# Patient Record
Sex: Female | Born: 1985 | Race: Asian | Hispanic: No | Marital: Married | Smoking: Never smoker
Health system: Southern US, Community
[De-identification: ages and names within clinical notes are randomized; demographics above are authoritative.]

## PROBLEM LIST (undated history)

## (undated) DIAGNOSIS — T8859XA Other complications of anesthesia, initial encounter: Secondary | ICD-10-CM

## (undated) DIAGNOSIS — O24419 Gestational diabetes mellitus in pregnancy, unspecified control: Secondary | ICD-10-CM

## (undated) DIAGNOSIS — K219 Gastro-esophageal reflux disease without esophagitis: Secondary | ICD-10-CM

---

## 2020-07-17 ENCOUNTER — Ambulatory Visit (INDEPENDENT_AMBULATORY_CARE_PROVIDER_SITE_OTHER): Payer: Medicaid Other | Admitting: Family Medicine

## 2020-07-17 ENCOUNTER — Other Ambulatory Visit: Payer: Self-pay

## 2020-07-17 VITALS — BP 100/62 | HR 73 | Wt 148.2 lb

## 2020-07-17 DIAGNOSIS — Z0000 Encounter for general adult medical examination without abnormal findings: Secondary | ICD-10-CM | POA: Diagnosis not present

## 2020-07-17 DIAGNOSIS — N920 Excessive and frequent menstruation with regular cycle: Secondary | ICD-10-CM

## 2020-07-17 DIAGNOSIS — Z23 Encounter for immunization: Secondary | ICD-10-CM

## 2020-07-17 NOTE — Patient Instructions (Addendum)
It was great seeing you today! Today we established care and are doing your blood work and you are getting your flu vaccine. I have also submitted a referral to OB/GYN. You should receive a call in the next 2 weeks to schedule an appointment, but if you do not hear from them please give [place] a call at the clinic  Please check-out at the front desk before leaving the clinic. I'd like to see you back in about 4 weeks to follow up on the dizziness you have been experiencing, but if you need to be seen earlier than that for any new issues we're happy to fit you in, just give [place] a call!  Visit Reminders: - Continue to work on your healthy eating habits and incorporating exercise into your daily life. - Medicine Changes: I recommend taking a prenatal vitamin with folic acid since you plan to get pregnant in the near future   Regarding lab work today:  Due to recent changes in healthcare laws, you may see the results of your imaging and laboratory studies on MyChart before your provider has had a chance to review them.  I understand that in some cases there may be results that are confusing or concerning to you. Not all laboratory results come back in the same time frame and you may be waiting for multiple results in order to interpret others.  Please give [place] 72 hours in order for your provider to thoroughly review all the results before contacting the office for clarification of your results. If everything is normal, you will get a letter in the mail or a message in My Chart. Please give [place] a call if you do not hear from [place] after 2 weeks.  Please bring all of your medications with you to each visit.    If you haven't already, sign up for My Chart to have easy access to your labs results, and communication with your primary care physician.  Feel free to call with any questions or concerns at any time, at [phone].   Take care,  Dr. Cora Collum [facility]

## 2020-07-17 NOTE — Progress Notes (Signed)
    SUBJECTIVE:   CHIEF COMPLAINT / HPI:   Ms. Vink is a 34yo who presents to establish care.  Dizziness: She currently complains of occasional dizziness. She states her blood pressures at home run on the lower side and thinks her dizziness may be associated. Denies having low blood sugar in the past. She denies the dizziness being positional when she turns or head or stands.   Heavy bleeding: Patient experiences a lot of bleeding during menstrual cycles last 6-7 days. She does report that her cycles do occur regularly. Needs to change more than 6-7 pads during the day. Does not notice blood clots. Feels week and tired during these episodes. Previous doctor referred her to gyn but they moved so were not able to see one. She is also preparing to have another child and expresses that she wants to make sure she is healthy prior to doing so.   Health maintenance: Believes last pap was in 2019, and that Hep C and HIV screens were done while pregnant in 2019.   Denies taking any medication. Had previously really bad allergies and saw an immunologist. Has not had issues with her allergies recently.  PERTINENT  PMH / PSH:  Seasonal allergies    OBJECTIVE:   BP 100/62   Pulse 73   Wt 148 lb 3.2 oz (67.2 kg)   SpO2 98%    General: well appearing, pleasant, NAD Heart: RRR no murmurs Lungs: CTAB. Normal WOB Abdomen: soft, non-distended, non-tender to palpation   ASSESSMENT/PLAN:   No problem-specific Assessment & Plan notes found for this encounter.  Menorrhagia  - check CBC for anemia - referral to gyn per patient's request   Health Maintenance - Hgb a1c, CBC, CMP - flu vaccine  Planning pregnancy - begin prenatal vitamin with folate   F/u in 2-4 weeks  Cora Collum, DO [facility]

## 2020-07-18 LAB — COMPREHENSIVE METABOLIC PANEL
ALT: 16 IU/L (ref 0–32)
AST: 15 IU/L (ref 0–40)
Albumin/Globulin Ratio: 1.7 (ref 1.2–2.2)
Albumin: 4.4 g/dL (ref 3.8–4.8)
Alkaline Phosphatase: 62 IU/L (ref 44–121)
BUN/Creatinine Ratio: 13 (ref 9–23)
BUN: 10 mg/dL (ref 6–20)
Bilirubin Total: 0.4 mg/dL (ref 0.0–1.2)
CO2: 24 mmol/L (ref 20–29)
Calcium: 9.1 mg/dL (ref 8.7–10.2)
Chloride: 102 mmol/L (ref 96–106)
Creatinine, Ser: 0.78 mg/dL (ref 0.57–1.00)
GFR calc Af Amer: 116 mL/min/{1.73_m2} (ref >59)
GFR calc non Af Amer: 101 mL/min/{1.73_m2} (ref >59)
Globulin, Total: 2.6 g/dL (ref 1.5–4.5)
Glucose: 87 mg/dL (ref 65–99)
Potassium: 4.1 mmol/L (ref 3.5–5.2)
Sodium: 139 mmol/L (ref 134–144)
Total Protein: 7 g/dL (ref 6.0–8.5)

## 2020-07-18 LAB — CBC WITH DIFFERENTIAL/PLATELET
Basophils Absolute: 0 10*3/uL (ref 0.0–0.2)
Basos: 0 %
EOS (ABSOLUTE): 0.1 10*3/uL (ref 0.0–0.4)
Eos: 1 %
Hematocrit: 39.1 % (ref 34.0–46.6)
Hemoglobin: 12.5 g/dL (ref 11.1–15.9)
Immature Grans (Abs): 0 10*3/uL (ref 0.0–0.1)
Immature Granulocytes: 0 %
Lymphocytes Absolute: 2.6 10*3/uL (ref 0.7–3.1)
Lymphs: 37 %
MCH: 27.1 pg (ref 26.6–33.0)
MCHC: 32 g/dL (ref 31.5–35.7)
MCV: 85 fL (ref 79–97)
Monocytes Absolute: 0.3 10*3/uL (ref 0.1–0.9)
Monocytes: 4 %
Neutrophils Absolute: 3.9 10*3/uL (ref 1.4–7.0)
Neutrophils: 58 %
Platelets: 256 10*3/uL (ref 150–450)
RBC: 4.61 x10E6/uL (ref 3.77–5.28)
RDW: 12.6 % (ref 11.7–15.4)
WBC: 6.9 10*3/uL (ref 3.4–10.8)

## 2020-07-18 LAB — HEMOGLOBIN A1C
Est. average glucose Bld gHb Est-mCnc: 111 mg/dL
Hgb A1c MFr Bld: 5.5 % (ref 4.8–5.6)

## 2020-10-05 ENCOUNTER — Encounter: Payer: Medicaid Other | Admitting: Obstetrics and Gynecology

## 2020-10-19 ENCOUNTER — Ambulatory Visit (INDEPENDENT_AMBULATORY_CARE_PROVIDER_SITE_OTHER): Payer: Self-pay | Admitting: Nurse Practitioner

## 2020-10-19 ENCOUNTER — Other Ambulatory Visit: Payer: Self-pay

## 2020-10-19 ENCOUNTER — Encounter: Payer: Self-pay | Admitting: Nurse Practitioner

## 2020-10-19 ENCOUNTER — Other Ambulatory Visit (HOSPITAL_COMMUNITY)
Admission: RE | Admit: 2020-10-19 | Discharge: 2020-10-19 | Disposition: A | Discharge status: Home / Self Care | Payer: Medicaid Other | Source: Ambulatory Visit | Attending: Obstetrics and Gynecology | Admitting: Obstetrics and Gynecology

## 2020-10-19 VITALS — BP 106/74 | HR 72 | Ht <= 58 in | Wt 146.7 lb

## 2020-10-19 DIAGNOSIS — N939 Abnormal uterine and vaginal bleeding, unspecified: Secondary | ICD-10-CM | POA: Diagnosis present

## 2020-10-19 DIAGNOSIS — Z01419 Encounter for gynecological examination (general) (routine) without abnormal findings: Secondary | ICD-10-CM

## 2020-10-19 MED ORDER — LEVONORGESTREL-ETHINYL ESTRAD 0.15-30 MG-MCG PO TABS: 1.0000 | ORAL_TABLET | Freq: Every day | ORAL | 11 refills | Status: DC

## 2020-10-19 NOTE — Progress Notes (Addendum)
GYNECOLOGY ANNUAL VISIT NOTE   History:  35 y.o. G1P1001 here today for extended menses. Last 2 cycles have been 15 days in length.  Wants to be pregnant.  Is planning a visit to her home country outside of the [place] for at least 2 months. She had a physical with CBC checked as she was having dizziness with her bleeding however she reports there was no anemia with that recent blood check.  She declines any blood work today.  Has noticed there are areas on the areola that get darker at the time of her cycle.  There is no drainage, no pain, no itching or peeling of skin on the areola.  She denies any abnormal vaginal discharge, bleeding, pelvic pain or other concerns.   History reviewed. No pertinent past medical history.  Past Surgical History:  Procedure Laterality Date  . CESAREAN SECTION      The following portions of the patient's history were reviewed and updated as appropriate: allergies, current medications, past family history, past medical history, past social history, past surgical history and problem list.    Health Maintenance:  Does not know when she had a pap - last child is 18.66 years of age and born in [place].  Review of Systems:  Pertinent items noted in HPI and remainder of comprehensive ROS otherwise negative.  Objective:  Physical Exam BP 106/74   Pulse 72   Ht 4\' 8"  (1.422 m)   Wt 146 lb 11.2 oz (66.5 kg)   BMI 32.89 kg/m  CONSTITUTIONAL: Well-developed, well-nourished female in no acute distress.  HENT:  Normocephalic, atraumatic. External right and left ear normal.  EYES: Conjunctivae and EOM are normal. Pupils are equal, round.  No scleral icterus.  NECK: Normal range of motion, supple, no masses SKIN: Skin is warm and dry. No rash noted. Not diaphoretic. No erythema. No pallor. NEUROLOGIC: Alert and oriented to person, place, and time. Normal muscle tone coordination. No cranial nerve deficit noted. PSYCHIATRIC: Normal mood and affect. Normal behavior.  Normal judgment and thought content. CARDIOVASCULAR: Normal heart rate noted RESPIRATORY: Effort and breath sounds normal, no problems with respiration noted ABDOMEN: Soft, no distention noted.   PELVIC: Normal appearing external genitalia; normal appearing vaginal mucosa and cervix.  No abnormal discharge noted.  Normal uterine size, no other palpable masses, no uterine or adnexal tenderness.  Pap done. MUSCULOSKELETAL: Normal range of motion. No edema noted. Breast:  No problems noted.  No masses, some darker areas seen on areola bilaterally but within normal range of glands seen on areola.   Labs and Imaging No results found.  Assessment & Plan:  1. Well female exam BMI 32 - weight loss advised Normal BP Nonsmoker  2. Abnormal vaginal bleeding Discussed plan to address bleeding since she wants to be pregnant.  Does want to control the bleeding while she is out of the country.  Denies any headaches or family history of blood clots. Will begin pills and take them while she is visiting out of the country. Will try to become pregnant once she returns from her visit. Reassured that breasts and nipple area is normal.  - GC/Chlamydia probe amp ([place])not at [facility] - Cytology - PAP( [place])   Routine preventative health maintenance measures emphasized. Please refer to After Visit Summary for other counseling recommendations.   Return in about 6 months (around 04/21/2021) for RN for BP check.   Total face-to-face time with patient: 20  minutes.  Over 50% of encounter  was spent on counseling and coordination of care.  Nolene Bernheim, RN, MSN, NP-BC Nurse Practitioner, [facility] for [employer], [facility] 10/19/2020 1:48 PM

## 2020-10-19 NOTE — Progress Notes (Signed)
Patient states the first 2-3 of cycle flow is light but she states that she becomes lightheaded. The remainder of cycle (4 days)  is heavy and patient states that she has cramping and lower back pain. Complains of excessive bleeds and bumps shows up around the nipple area but goes away after cycle. Patient stated that she does not remember when she had a pap smear.  Also, offered food market for patient and she declined  Swara Donze, CMA

## 2020-10-19 NOTE — Patient Instructions (Signed)
Oral Contraception Use Oral contraceptive pills (OCPs) are medicines that prevent pregnancy. OCPs work by:  Preventing the ovaries from releasing eggs.  Thickening mucus in the lower part of the uterus (cervix). This prevents sperm from entering the uterus.  Thinning the lining of the uterus (endometrium). This prevents a fertilized egg from attaching to the endometrium. Discuss possible side effects of OCPs with your health care provider. It can take 2-3 months for your body to adjust to changes in hormone levels. What are the risks? OCPs can sometimes cause side effects, such as:  Headache.  Depression.  Trouble sleeping.  Nausea and vomiting.  Breast tenderness.  Irregular bleeding or spotting during the first several months.  Bloating or fluid retention.  Increase in blood pressure. OCPs with estrogen and progestins may slightly increase the risk of:  Blood clots.  Heart attack.  Stroke. How to take OCPs Follow instructions from your health care provider about how to take your first cycle of OCPs. There are 2 types of OCPs. The first, combination OCPs, have both estrogen and progestins. The second, progestin-only pills, have only progestin.  For combination OCPs, you may start the pill: ? On day 1 of your menstrual period. ? On the first Sunday after your period starts, or on the day you get your prescription. ? At any time of your cycle. ? If you start taking the pill within 5 days after the start of your period, you will not need a backup form of birth control, such as condoms. ? If you start at any other time of your menstrual cycle, you will need to use a backup form of birth control.  For progestin-only OCPs: ? Ideally, you can start taking the pill on the first day of your menstrual period, but you can start it on any other day too. ? These pills will protect you from pregnancy after taking it for 2 days (48 hours). You can stop using a backup form of birth  control after that time. It is important that you take this pill at the same time every day. Even taking it 3 hours late can increase the risk of pregnancy. No matter which day you start the OCP, you will always start a new pack on that same day of the week. Have an extra pack of OCPs and a backup contraceptive method available in case you miss some pills or lose your OCP pack. Missed doses Follow instructions from your health care provider for missed doses. Information about missed doses can also be found in the patient information sheet that comes with your pack of pills.  In general, for combined OCPs: ? If you forget to take the pill for 1 day, take it as soon as you can. This may mean taking 2 pills on the same day and at the same time. Take the next day's pill at the regular time. ? If you forget to take the pill for 2 days in a row, take 2 tablets on the day you remember and 2 tablets on the following day. A backup form of birth control should be used for 7 days after you are back on schedule. ? If you forget to take the pill for 3 days in a row, call your health care provider for directions on when to restart taking your pills. Do not take the missed pills. A backup form of birth control will be needed for 7 days once you restart your pills. ? If you use a pack that   contains inactive pills and you miss 1 or more of the inactive pills, you do not need to take the missed doses. Skip them and start the new pack on the regular day.  For progestin-only OCPs: ? If your dose is 3 hours or more late, or if you miss 1 or more doses, take 1 missed pill as soon as you can. ? If you miss one or more doses, you must use a backup form of birth control. Some brands of progestin-only pills recommend using a backup form of birth control for 48 hours after a missed or late dose while others recommend 7 days. If you are not sure what to do, call your health care provider or check the patient information sheet that  came with your pills.   Follow these instructions at home:  Do not use any products that contain nicotine or tobacco. These include cigarettes, chewing tobacco, or vaping devices, such as e-cigarettes. If you need help quitting, ask your health care provider.  Always use a condom to protect against STIs (sexually transmitted infections). Oral contraception pills do not protect against STIs.  Use a calendar to mark the days of your menstrual period.  Read the information sheet and directions that came with your OCP. Talk to your health care provider if you have questions. Contact a health care provider if:  You develop nausea and vomiting.  You have abnormal vaginal discharge or bleeding.  You develop a rash.  You miss your menstrual period. Depending on the type of OCP you are taking, this may be a sign of pregnancy.  You are losing your hair.  You need treatment for mood swings or depression.  You get dizzy when taking the OCP.  You develop acne after taking the OCP.  You become pregnant or think you may be pregnant.  You have diarrhea, constipation, and abdominal pain or cramps.  You are not sure what to do after missing pills. Get help right away if:  You develop chest pain.  You develop shortness of breath.  You have an uncontrolled or severe headache.  You develop numbness or slurred speech.  You develop vision or speech problems.  You develop pain, redness, and swelling in your legs.  You develop weakness or numbness in your arms or legs. These symptoms may represent a serious problem that is an emergency. Do not wait to see if the symptoms will go away. Get medical help right away. Call your local emergency services (911 in the [place] Do not drive yourself to the hospital. Summary  Oral contraceptive pills (OCPs) are medicines that you take to prevent pregnancy.  OCPs do not prevent sexually transmitted infections (STIs). Always use a condom to protect  against STIs.  When you start an OCP, be aware that it can take 2-3 months for your body to adjust to changes in hormone levels.  Read all the information and directions that come with your OCP. This information is not intended to replace advice given to you by your health care provider. Make sure you discuss any questions you have with your health care provider. Document Revised: 04/09/2020 Document Reviewed: 04/09/2020 Elsevier Patient Education  2021 Elsevier Inc.  

## 2020-10-20 LAB — CYTOLOGY - PAP
Adequacy: ABSENT
Comment: NEGATIVE
Diagnosis: NEGATIVE
High risk HPV: NEGATIVE

## 2020-10-20 LAB — GC/CHLAMYDIA PROBE AMP (~~LOC~~) NOT AT ARMC
Chlamydia: NEGATIVE
Comment: NEGATIVE
Comment: NORMAL
Neisseria Gonorrhea: NEGATIVE

## 2020-10-22 MED ORDER — FLUCONAZOLE 150 MG PO TABS: 150.0000 mg | ORAL_TABLET | Freq: Once | ORAL | 0 refills | Status: AC

## 2020-10-22 NOTE — Addendum Note (Signed)
Addended by: Currie Paris on: 10/22/2020 08:22 AM   Modules accepted: Orders

## 2020-10-25 NOTE — Addendum Note (Signed)
Addended by: Currie Paris on: 10/25/2020 08:48 PM   Modules accepted: Level of Service

## 2021-10-22 NOTE — Progress Notes (Incomplete)
° ° ° °  SUBJECTIVE:   CHIEF COMPLAINT / HPI:   Erin Avery is a 36 y.o. female presents for abdominal pain   Positive pregnancy LMP Feb 6th. Period was certain, cycles are regular. No birth control for 3 months prior.Takes folic acid. Took home pregnancy test which was positive. Has one son age 75. Pt is G1P1.  Abdominal pain  Pt endorses right sided lower abdominal pain started 2 weeks ago, intermittent in nature. Sometimes does have it on the left. Onset was gradual. No radiation.  Described as mild sharp/dull type of pain. Continuous most of the time. Alleviated by prune juice. Exacerbated by spicy food. Severity 9 /10. Associated symptoms include vaginal spotting, nausea and some cramps yesterday. Does endorse constipation. Last BM yesterday.  Denies vaginal bleeding, fluid leakage. First pregnancy she has right sided pain, ectopic pregnancy was suspected, it was worked up which was negative.    ***  Flowsheet Row [facility]  PHQ-9 Total Score 0        PERTINENT  PMH / PSH:   OBJECTIVE:   BP 106/72    Pulse 85    Ht 4\' 8"  (1.422 m)    Wt 145 lb 12.8 oz (66.1 kg)    LMP 09/20/2021 (Exact Date)    SpO2 98%    BMI 32.69 kg/m    General: Alert, no acute distress Cardio: Normal S1 and S2, RRR, no r/m/g Pulm: CTAB, normal work of breathing Abdomen: Bowel sounds normal. Abdomen soft and non-tender.  Extremities: No peripheral edema.  Neuro: Cranial nerves grossly intact    Pelvic Exam chaperoned by CMA **         External: normal female genitalia without lesions or masses        Vagina: normal without lesions or masses        Cervix: normal without lesions or masses        Pap smear: performed        Samples for Wet prep, GC/Chlamydia obtained   ASSESSMENT/PLAN:   No problem-specific Assessment & Plan notes found for this encounter.    11/18/2021, MD PGY-3 [facility]

## 2021-10-25 ENCOUNTER — Other Ambulatory Visit: Payer: Self-pay

## 2021-10-25 ENCOUNTER — Inpatient Hospital Stay (HOSPITAL_COMMUNITY): Payer: Medicaid Other

## 2021-10-25 ENCOUNTER — Ambulatory Visit (INDEPENDENT_AMBULATORY_CARE_PROVIDER_SITE_OTHER): Payer: Medicaid Other | Admitting: Family Medicine

## 2021-10-25 ENCOUNTER — Inpatient Hospital Stay (HOSPITAL_COMMUNITY)
Admission: AD | Admit: 2021-10-25 | Discharge: 2021-10-25 | Disposition: A | Discharge status: Home / Self Care | Payer: Medicaid Other | Attending: Obstetrics and Gynecology | Admitting: Obstetrics and Gynecology

## 2021-10-25 ENCOUNTER — Encounter (HOSPITAL_COMMUNITY): Payer: Self-pay | Admitting: Obstetrics and Gynecology

## 2021-10-25 VITALS — BP 106/72 | HR 85 | Ht <= 58 in | Wt 145.8 lb

## 2021-10-25 DIAGNOSIS — R10813 Right lower quadrant abdominal tenderness: Secondary | ICD-10-CM | POA: Insufficient documentation

## 2021-10-25 DIAGNOSIS — O26891 Other specified pregnancy related conditions, first trimester: Secondary | ICD-10-CM | POA: Insufficient documentation

## 2021-10-25 DIAGNOSIS — O2341 Unspecified infection of urinary tract in pregnancy, first trimester: Secondary | ICD-10-CM | POA: Diagnosis not present

## 2021-10-25 DIAGNOSIS — R109 Unspecified abdominal pain: Secondary | ICD-10-CM | POA: Diagnosis not present

## 2021-10-25 DIAGNOSIS — R110 Nausea: Secondary | ICD-10-CM | POA: Insufficient documentation

## 2021-10-25 DIAGNOSIS — Z3491 Encounter for supervision of normal pregnancy, unspecified, first trimester: Secondary | ICD-10-CM

## 2021-10-25 DIAGNOSIS — R1031 Right lower quadrant pain: Secondary | ICD-10-CM | POA: Diagnosis not present

## 2021-10-25 DIAGNOSIS — Z3A01 Less than 8 weeks gestation of pregnancy: Secondary | ICD-10-CM | POA: Insufficient documentation

## 2021-10-25 DIAGNOSIS — N8312 Corpus luteum cyst of left ovary: Secondary | ICD-10-CM | POA: Diagnosis not present

## 2021-10-25 DIAGNOSIS — Z6730 Type AB blood, Rh positive: Secondary | ICD-10-CM | POA: Diagnosis not present

## 2021-10-25 DIAGNOSIS — N390 Urinary tract infection, site not specified: Secondary | ICD-10-CM | POA: Diagnosis not present

## 2021-10-25 DIAGNOSIS — Z3490 Encounter for supervision of normal pregnancy, unspecified, unspecified trimester: Secondary | ICD-10-CM

## 2021-10-25 LAB — CBC
HCT: 38.4 % (ref 36.0–46.0)
Hemoglobin: 12.5 g/dL (ref 12.0–15.0)
MCH: 27.4 pg (ref 26.0–34.0)
MCHC: 32.6 g/dL (ref 30.0–36.0)
MCV: 84 fL (ref 80.0–100.0)
Platelets: 249 10*3/uL (ref 150–400)
RBC: 4.57 MIL/uL (ref 3.87–5.11)
RDW: 13.1 % (ref 11.5–15.5)
WBC: 7.5 10*3/uL (ref 4.0–10.5)
nRBC: 0 % (ref 0.0–0.2)

## 2021-10-25 LAB — URINALYSIS, ROUTINE W REFLEX MICROSCOPIC
Bilirubin Urine: NEGATIVE
Glucose, UA: NEGATIVE mg/dL
Hgb urine dipstick: NEGATIVE
Ketones, ur: NEGATIVE mg/dL
Nitrite: NEGATIVE
Protein, ur: NEGATIVE mg/dL
Specific Gravity, Urine: 1.025 (ref 1.005–1.030)
pH: 5 (ref 5.0–8.0)

## 2021-10-25 LAB — POCT URINE PREGNANCY: Preg Test, Ur: POSITIVE — AB

## 2021-10-25 LAB — HCG, QUANTITATIVE, PREGNANCY: hCG, Beta Chain, Quant, S: 16956 m[IU]/mL — ABNORMAL HIGH (ref <5)

## 2021-10-25 LAB — ABO/RH: ABO/RH(D): AB POS

## 2021-10-25 MED ORDER — CEFADROXIL 500 MG PO CAPS: 500.0000 mg | ORAL_CAPSULE | Freq: Two times a day (BID) | ORAL | 0 refills | Status: AC

## 2021-10-25 NOTE — Patient Instructions (Signed)
Thank you for coming to see me today. It was a pleasure. You are pregnant-congratulations!!  ? ?Today we discussed your belly pains, it could be due to constipation but we need to rule out an ectropic pregnancy, I recommend going to the [facility] immediately for an ultrasound.  ? ?We will get some labs today.  If they are abnormal or we need to do something about them, I will call you.  If they are normal, I will send you a message on MyChart (if it is active) or a letter in the mail.  If you don't hear from [place] in 2 weeks, please call the office at the number below.  ? ?If everything is normal, come back for an initial ob visit which you can book at the front desk  ? ?If you have any questions or concerns, please do not hesitate to call the office at [phone]. ? ?Best wishes,  ? ?Dr Allena Katz   ?

## 2021-10-25 NOTE — Discharge Instructions (Signed)
Return to care  If you have heavier bleeding that soaks through more than 2 pads per hour for an hour or more If you bleed so much that you feel like you might pass out or you do pass out If you have significant abdominal pain that is not improved with Tylenol   

## 2021-10-25 NOTE — MAU Provider Note (Signed)
?History  ?  ? ?[id number] ? ?Arrival date and time: 10/25/21 1011 ?  ? ?Chief Complaint  ?Patient presents with  ? Abdominal Pain  ? ? ? ?HPI ?Erin Avery is a 36 y.o. at [redacted]w[redacted]d by LMP with PMHx notable for previous c/section, who presents for abdominal pain. ?Reports abdominal pain that started a few weeks ago & worsened in the last few days. Pain in right lower quadrant. Rates pain 7/10. No aggravating factors. Hasn't treated symptoms. Reports some daily morning nausea; no vomiting. Also reports dysuria for the last few days. Denies fever/chills, diarrhea, constipation, flank pain, or vaginal discharge. Had some vaginal spotting last week; currently denies vaginal bleeding.  ? ?OB History   ? ? Gravida  ?2  ? Para  ?1  ? Term  ?1  ? Preterm  ?   ? AB  ?   ? Living  ?1  ?  ? ? SAB  ?   ? IAB  ?   ? Ectopic  ?   ? Multiple  ?   ? Live Births  ?1  ?   ?  ?  ? ? ?History reviewed. No pertinent past medical history. ? ?Past Surgical History:  ?Procedure Laterality Date  ? CESAREAN SECTION    ? ? ?Family History  ?Problem Relation Age of Onset  ? Diabetes Mother   ? Hypertension Mother   ? Asthma Father   ? ? ?No Known Allergies ? ?No current facility-administered medications on file prior to encounter.  ? ?Current Outpatient Medications on File Prior to Encounter  ?Medication Sig Dispense Refill  ? acetaminophen (TYLENOL) 500 MG tablet Take 500 mg by mouth every 6 (six) hours as needed.    ? levonorgestrel-ethinyl estradiol (PORTIA-28) 0.15-30 MG-MCG tablet Take 1 tablet by mouth daily. 28 tablet 11  ? ? ? ?ROS ?Pertinent positives and negative per HPI, all others reviewed and negative ? ?Physical Exam  ? ?BP 112/71   Pulse 78   Temp 98.8 ?F (37.1 ?C)   Resp 18   LMP 09/20/2021 (Exact Date)  ? ?Patient Vitals for the past 24 hrs: ? BP Temp Pulse Resp  ?10/25/21 1037 112/71 98.8 ?F (37.1 ?C) 78 18  ? ? ?Physical Exam ?Vitals and nursing note reviewed.  ?Constitutional:   ?   General: She is not in acute  distress. ?   Appearance: She is well-developed. She is not ill-appearing.  ?HENT:  ?   Head: Normocephalic and atraumatic.  ?Pulmonary:  ?   Effort: Pulmonary effort is normal. No respiratory distress.  ?Abdominal:  ?   General: Abdomen is flat. Bowel sounds are normal. There is no distension.  ?   Palpations: Abdomen is soft.  ?   Tenderness: There is abdominal tenderness (tender to deep palpation in RLQ) in the right lower quadrant. There is no right CVA tenderness, left CVA tenderness, guarding or rebound.  ?Skin: ?   General: Skin is warm and dry.  ?Neurological:  ?   Mental Status: She is alert.  ?Psychiatric:     ?   Mood and Affect: Mood normal.     ?   Behavior: Behavior normal.  ?  ? ?Labs ?Results for orders placed or performed during the hospital encounter of 10/25/21 (from the past 24 hour(s))  ?Urinalysis, Routine w reflex microscopic Urine, Clean Catch     Status: Abnormal  ? Collection Time: 10/25/21 11:01 AM  ?Result Value Ref Range  ? Color, Urine YELLOW YELLOW  ?  APPearance HAZY (A) CLEAR  ? Specific Gravity, Urine 1.025 1.005 - 1.030  ? pH 5.0 5.0 - 8.0  ? Glucose, UA NEGATIVE NEGATIVE mg/dL  ? Hgb urine dipstick NEGATIVE NEGATIVE  ? Bilirubin Urine NEGATIVE NEGATIVE  ? Ketones, ur NEGATIVE NEGATIVE mg/dL  ? Protein, ur NEGATIVE NEGATIVE mg/dL  ? Nitrite NEGATIVE NEGATIVE  ? Leukocytes,Ua TRACE (A) NEGATIVE  ? RBC / HPF 0-5 0 - 5 RBC/hpf  ? WBC, UA 6-10 0 - 5 WBC/hpf  ? Bacteria, UA RARE (A) NONE SEEN  ? Squamous Epithelial / LPF 0-5 0 - 5  ? Mucus PRESENT   ?CBC     Status: None  ? Collection Time: 10/25/21 11:46 AM  ?Result Value Ref Range  ? WBC 7.5 4.0 - 10.5 K/uL  ? RBC 4.57 3.87 - 5.11 MIL/uL  ? Hemoglobin 12.5 12.0 - 15.0 g/dL  ? HCT 38.4 36.0 - 46.0 %  ? MCV 84.0 80.0 - 100.0 fL  ? MCH 27.4 26.0 - 34.0 pg  ? MCHC 32.6 30.0 - 36.0 g/dL  ? RDW 13.1 11.5 - 15.5 %  ? Platelets 249 150 - 400 K/uL  ? nRBC 0.0 0.0 - 0.2 %  ?ABO/Rh     Status: None  ? Collection Time: 10/25/21 11:46 AM  ?Result  Value Ref Range  ? ABO/RH(D) AB POS   ? No rh immune globuloin    ?  NOT A RH IMMUNE GLOBULIN CANDIDATE, PT RH POSITIVE ?Performed at [facility], [address] [place], [place] [id number] ?  ?hCG, quantitative, pregnancy     Status: Abnormal  ? Collection Time: 10/25/21 11:46 AM  ?Result Value Ref Range  ? hCG, Beta Chain, Quant, S 16,956 (H) <5 mIU/mL  ? ? ?Imaging ?US OB LESS THAN 14 WEEKS WITH OB TRANSVAGINAL ? ?Result Date: 10/25/2021 ?CLINICAL DATA:  Abdominal pain for 2 weeks during first trimester of pregnancy, LMP 09/20/2021 EXAM: OBSTETRIC <14 WK US AND TRANSVAGINAL OB US TECHNIQUE: Both transabdominal and transvaginal ultrasound examinations were performed for complete evaluation of the gestation as well as the maternal uterus, adnexal regions, and pelvic cul-de-sac. Transvaginal technique was performed to assess early pregnancy. COMPARISON:  None FINDINGS: Intrauterine gestational sac: Present, single Yolk sac:  Present Embryo:  Not identified Cardiac Activity: N/A Heart Rate: N/A  bpm MSD: 10.9 mm   5 w   5 d Subchorionic hemorrhage:  None identified Maternal uterus/adnexae: Uterus otherwise unremarkable. LEFT ovary measures 2.7 x 2.8 x 2.3 cm conned and contains a small corpus luteal cyst. RIGHT ovary normal size and morphology, 2.5 x 1.9 x 1.5 cm. Trace free pelvic fluid. No adnexal masses. IMPRESSION: Small gestational sac within the uterus containing a yolk sac, EGA [redacted] weeks 5 days by MSD. No fetal pole identified to establish viability; may consider follow-up ultrasound in 14 days to establish viability if clinically indicated. No acute abnormalities. Electronically Signed   By: Ulyses SouthwardMark  Boles M.D.   On: 10/25/2021 12:31   ? ?MAU Course  ?Procedures ?Lab Orders    ?     Culture, OB Urine    ?     Urinalysis, Routine w reflex microscopic Urine, Clean Catch    ?     CBC    ?     hCG, quantitative, pregnancy    ?Meds ordered this encounter  ?Medications  ? cefadroxil (DURICEF) 500 MG capsule  ?   Sig: Take 1 capsule (500 mg total) by mouth 2 (two)  times daily for 7 days.  ?  Dispense:  14 capsule  ?  Refill:  0  ?  Order Specific Question:   Supervising Provider  ?  AnswerMilas Hock [[id number]]  ? ?Imaging Orders    ?     US OB LESS THAN 14 WEEKS WITH OB TRANSVAGINAL    ? ? ?MDM ?+UPT ?UA, CBC, ABO/Rh, quant hCG, and [place] today to rule out ectopic pregnancy which can be life threatening.  ? ?Patient presents with abdominal pain. Benign abdominal exam. Afebrile.  ?Ultrasound shows intrauterine gestational sac with yolk sac. Appropriate for dating by LMP.  ? ?U/a with some leuks & bacteria. Patient reports 2 day history of dysuria. Will start on duricef. Urine culture sent.  ? ?Assessment and Plan  ? ?1. Normal IUP (intrauterine pregnancy) on prenatal ultrasound, first trimester  ?-Patient to follow up with Family Medicine for prenatal care. Consider viability ultrasound in 2 weeks which can be ordered by them.   ?2. Abdominal pain during pregnancy in first trimester  ?-Reviewed miscarriage precautions & reasons to return to MAU  ?3. UTI (urinary tract infection) during pregnancy, first trimester  ?-Rx duricef ?-Urine culture pending  ?4. [redacted] weeks gestation of pregnancy   ? ? ? ?Judeth Horn, NP ?10/25/21 ?1:15 PM ? ? ?

## 2021-10-25 NOTE — MAU Note (Signed)
.  Erin Avery is a 36 y.o. at [redacted]w[redacted]d here in MAU reporting: pain on lower right abd that started about 2 weeks ago. Went to PCP today and was sent here due to pain. Denies any vaginal discharge. Had some spotting a few days ago but none today.  ?LMP: 09/20/21 ?Onset of complaint: 2 weeks ?Pain score: 7/10 ?Vitals:  ? 10/25/21 1037  ?BP: 112/71  ?Pulse: 78  ?Resp: 18  ?Temp: 98.8 ?F (37.1 ?C)  ?   ?FHT:n/a ?Lab orders placed from triage:  u/a ? ?

## 2021-10-26 DIAGNOSIS — Z3490 Encounter for supervision of normal pregnancy, unspecified, unspecified trimester: Secondary | ICD-10-CM | POA: Insufficient documentation

## 2021-10-26 LAB — CULTURE, OB URINE
Culture: <10000 — AB
Special Requests: NORMAL

## 2021-10-26 NOTE — Assessment & Plan Note (Addendum)
Congratulated pt and husband on pregnancy. Given lower abdominal pain there is a concern for ectopic pregnancy. Pt is hemodynamically stable which is reassuring. Recommended pt go to the MAU immediately for early [place]. Obtained OB labs. Recommended pt should follow up in clinic for initial OB visit in the new few weeks and continue taking pre-natals/folic acid. ?

## 2021-10-28 LAB — HCV INTERPRETATION

## 2021-10-28 LAB — MICROSCOPIC EXAMINATION
Casts: NONE SEEN /lpf
Epithelial Cells (non renal): >10 /hpf — AB (ref 0–10)
RBC, Urine: NONE SEEN /hpf (ref 0–2)

## 2021-10-28 LAB — CBC/D/PLT+RPR+RH+ABO+RUBIGG...
Antibody Screen: NEGATIVE
Basophils Absolute: 0 10*3/uL (ref 0.0–0.2)
Basos: 0 %
Bilirubin, UA: NEGATIVE
EOS (ABSOLUTE): 0.1 10*3/uL (ref 0.0–0.4)
Eos: 1 %
Glucose, UA: NEGATIVE
HCV Ab: NONREACTIVE
HIV Screen 4th Generation wRfx: NONREACTIVE
Hematocrit: 36.3 % (ref 34.0–46.6)
Hemoglobin: 11.9 g/dL (ref 11.1–15.9)
Hepatitis B Surface Ag: NEGATIVE
Immature Grans (Abs): 0 10*3/uL (ref 0.0–0.1)
Immature Granulocytes: 0 %
Ketones, UA: NEGATIVE
Lymphocytes Absolute: 2.1 10*3/uL (ref 0.7–3.1)
Lymphs: 29 %
MCH: 27 pg (ref 26.6–33.0)
MCHC: 32.8 g/dL (ref 31.5–35.7)
MCV: 82 fL (ref 79–97)
Monocytes Absolute: 0.3 10*3/uL (ref 0.1–0.9)
Monocytes: 4 %
Neutrophils Absolute: 4.9 10*3/uL (ref 1.4–7.0)
Neutrophils: 66 %
Nitrite, UA: NEGATIVE
Platelets: 267 10*3/uL (ref 150–450)
RBC, UA: NEGATIVE
RBC: 4.41 x10E6/uL (ref 3.77–5.28)
RDW: 12.9 % (ref 11.7–15.4)
RPR Ser Ql: NONREACTIVE
Rh Factor: POSITIVE
Rubella Antibodies, IGG: 7.88 index (ref >0.99)
Specific Gravity, UA: 1.025 (ref 1.005–1.030)
Urobilinogen, Ur: 0.2 mg/dL (ref 0.2–1.0)
WBC: 7.5 10*3/uL (ref 3.4–10.8)
pH, UA: 6 (ref 5.0–7.5)

## 2021-10-28 LAB — HGB FRACTIONATION CASCADE
Hgb A2: 2.7 % (ref 1.8–3.2)
Hgb A: 97.3 % (ref 96.4–98.8)
Hgb F: 0 % (ref 0.0–2.0)
Hgb S: 0 %

## 2021-10-28 LAB — URINE CULTURE, OB REFLEX

## 2021-11-04 ENCOUNTER — Other Ambulatory Visit (HOSPITAL_COMMUNITY)
Admission: RE | Admit: 2021-11-04 | Discharge: 2021-11-04 | Disposition: A | Discharge status: Home / Self Care | Payer: Medicaid Other | Source: Ambulatory Visit | Attending: Family Medicine | Admitting: Family Medicine

## 2021-11-04 ENCOUNTER — Ambulatory Visit (INDEPENDENT_AMBULATORY_CARE_PROVIDER_SITE_OTHER): Payer: Medicaid Other | Admitting: Family Medicine

## 2021-11-04 ENCOUNTER — Other Ambulatory Visit: Payer: Self-pay

## 2021-11-04 ENCOUNTER — Encounter: Payer: Self-pay | Admitting: Family Medicine

## 2021-11-04 VITALS — BP 105/67 | HR 77 | Wt 147.0 lb

## 2021-11-04 DIAGNOSIS — Z3491 Encounter for supervision of normal pregnancy, unspecified, first trimester: Secondary | ICD-10-CM | POA: Insufficient documentation

## 2021-11-04 DIAGNOSIS — Z3A01 Less than 8 weeks gestation of pregnancy: Secondary | ICD-10-CM | POA: Insufficient documentation

## 2021-11-04 NOTE — Patient Instructions (Signed)
It was wonderful to meet you today. Thank you for allowing me to be a part of your care. Below is a short summary of what we discussed at your visit today: ? ?Pregnancy ?Today we believe you are 6 weeks and 3 days along.  ? ?Go to your ultrasound appointment, details on the next page.  ? ?Go to your next prenatal appointment in about one month. Details on the next page. At this next appointment, you will need a 1-hour glucose test to screen for pregnancy related diabetes.  ? ?Keep taking your prenatal multivitamin.  ? ?Prenatal Classes ?Go to [email or web address] for more information on the pregnancy and child birth classes that [place] has to offer.  ? ?Emergency Planning ?If you experience any vaginal bleeding, leakage of fluids, don't feel your baby moving as much, or start to have contractions less than 5 minutes apart please go directly to the Maternal Assessment Unit at [facility] for evaluation. ? ?Maternity and women's care services located on the [place] side of [facility] [place]. [facility] (Entrance C off [address]).  ?[address] ?[place],  [place]  [address] ? ?  ? ?Maternal Mental Health ?If you start to develop the below symptoms of depression, please reach out to [place] for an appointment. There is also a [place] at [phone]). This hotline has trained counselors, doulas, and midwifes to real-time support, information, and resources.  ?Feeling sad or hopeless most of the time ?Lack of interest in things you used to enjoy ?Less interest in caring for yourself (dressing, fixing hair) ?Trouble concentrating ?Trouble coping with daily tasks ?Constant worry about your baby ?Sleeping or eating too much or too little ?Feeling very anxious or nervous ?Unexplained irritability or anger ?Unwanted or scary thoughts ?Feeling that you are not a good mother ?Thoughts of hurting yourself or  your baby ? ?If you feel you are experiencing a mental health crisis, please reach out to the [place] at 1-800-273-TALK [phone]) or go directly to the [facility], open 24/7.  ?[phone] ?[address] [place], [place] [address]  ? ? ?Please bring all of your medications to every appointment! ? ?If you have any questions or concerns, please do not hesitate to contact us via phone or MyChart message.  ? ?Ezequiel Essex, MD  ?

## 2021-11-04 NOTE — Progress Notes (Signed)
?Patient Name: Erin Avery ?Date of Birth: 11/12/1985 ?[place] Initial Prenatal Visit ? ?Erin Avery is a 36 y.o. year old G2P1001 at [redacted]w[redacted]d who presents for her initial prenatal visit. ?Pregnancy is planned ?She reports nausea. ?She is taking a prenatal vitamin.  ?She denies pelvic pain or vaginal bleeding.  ? ?Minor n/v, still able to eat ?Some abdominal pain, L then R, intermittent, worse when constipated ? ?Pregnancy Dating: ?The patient is dated by LMP.  ?LMP: 09/20/2021 ?Period is certain:  Yes.  ?Periods were regular:  Yes.  ?LMP was a typical period:  Yes.  ?Using hormonal contraception in 3 months prior to conception: No ? ?Lab Review: ?Blood type: AB POS ?Rh Status: + ?Antibody screen: Negative ?HIV: Negative ?RPR: Negative ?Hemoglobin electrophoresis reviewed: Yes - normal ?Results of OB urine culture are: Negative ?Rubella: Immune ?Hep C Ab: Negative ?Varicella status is Unknown - never had chicken pox infection, unsure if she has had vaccine ? ?PMH: Reviewed and as detailed below: ?HTN: No  ?Gestational Hypertension/preeclampsia: No  ?Type 1 or 2 Diabetes: No  ?Depression:  No  ?Seizure disorder:  No ?VTE: No ,  ?History of STI No,  ?Abnormal Pap smear:  No, ?Genital herpes simplex:  No  ? ?PSH: ?Gynecologic Surgery:  CS with first pregnancy ?Surgical history reviewed, no prior surgeries before CS ? ?Obstetric History: ?Obstetric history tab updated and reviewed.  ?Summary of prior pregnancies: Healthy ?Cesarean delivery: Yes  ?Gestational Diabetes:  No ?Hypertension in pregnancy: No ?History of preterm birth: No ?History of LGA/SGA infant:  No ?History of shoulder dystocia: No ?Indications for referral were reviewed, and the patient has no obstetric indications for referral to [facility] at this time.  ? ?Social History: ?Partner's name: Erin Avery, husband ?Tobacco use: No ?Alcohol use:  No ?Other substance use:  No ? ?Current Medications:  ?Prenatal  vitamins ?No prescription medications ?No over the counter medications ?Reviewed and appropriate in pregnancy.  ? ?Genetic and Infection Screen: ?Flow Sheet Updated Yes ? ?Prenatal Exam: ? ?Blood pressure 105/67, pulse 77, weight 147 lb (66.7 kg), last menstrual period 09/20/2021.  ? ?Physical Exam ?General: Awake, alert, oriented, no acute distress ?Respiratory: Normal work of breathing, no respiratory distress ?Neuro: Cranial nerves II through X grossly intact, able to move all extremities spontaneously ?Vulva: Normal appearing vulva without rashes, lesions, or deformities ?Vagina: Pale pink rugated vaginal tissue without obvious lesions, physiologic discharge of whitish color, cervix erythematous, no overt tenderness with swab, cervix with minor bleeding s/p PAP brush ? ?Fetal heart tones: Not assessed, given early gestation ? ?Assessment/Plan: ? ?Erin Avery is a 36 y.o. G2P1001 at [redacted]w[redacted]d who presents to initiate prenatal care. She is doing well.  ?Current pregnancy issues include none. ? ?Routine prenatal care: ?Ultrasound has been ordered for viability confirmation. Dating tab updated. ?Pre-pregnancy weight updated. Expected weight gain this pregnancy is 25-35 pounds  ?Prenatal labs reviewed, all normal/negative ?Indications for referral to HROB were reviewed and the patient does not meet criteria for referral.  ?Medication list reviewed and updated.  ?Recommended patient see a dentist for regular care.  ?Bleeding and pain precautions reviewed. ?Importance of prenatal vitamins reviewed.  ?Genetic screening not yet offered, as cannot perform before 10 weeks. Will need offered at next appointment.  ?The patient has the following indications for aspirinto begin 81 mg at 12-16 weeks: ?One high risk condition: no single high risk condition  ?MORE than one moderate risk condition: low SES   and  high risk ethnicity ?Aspirin was  recommended today based upon above risk factors (one high risk condition or more than  one moderate risk factor)  ?The patient will not be age 35 or over at time of delivery. Referral to genetic counseling was not offered today.  ?The patient has the following risk factors for preexisting diabetes: BMI > 25 and high risk ethnicity (Latino, [place] American, Native American, [place], Asian Optometrist) . An early 1 hour glucose tolerance test was not ordered - needs to be completed at next visit at 12 weeks.  ?Pregnancy Medical Home and PHQ-9 forms completed, problems noted: Completed, no problems identified ? ?2. Pregnancy issues include the following which were addressed today:  ? ?Ordered viability Korea to confirm appropriate growth, scheduled for 4/06. This is because MAU [place] was not able to measure crown-rump length.  ?Varicella status - unknown ?Hep B - unknown if vaccinated ?Needs early 1 hour GTT at next visit ?Patient desires CS, does not want to [facility], will need connection with OB in late 2nd trimester ?Needs genetic testing discussion at next appointment.  ?Follow up 4 weeks for next prenatal visit, scheduled for 12/13/21 at 12.[redacted] weeks gestation with Dr. Arby Avery ? ?Ezequiel Essex, MD ? ?

## 2021-11-04 NOTE — Assessment & Plan Note (Signed)
Initial prenatal, low risk pregnancy. Will order viability Korea to check crown-rump length as that was not able to be measured in MAU previously.  ?

## 2021-11-05 LAB — CERVICOVAGINAL ANCILLARY ONLY
Chlamydia: NEGATIVE
Comment: NEGATIVE
Comment: NEGATIVE
Comment: NORMAL
Neisseria Gonorrhea: NEGATIVE
Trichomonas: NEGATIVE

## 2021-11-05 LAB — CYTOLOGY - PAP
Comment: NEGATIVE
Diagnosis: NEGATIVE
High risk HPV: NEGATIVE

## 2021-11-15 ENCOUNTER — Ambulatory Visit
Admission: RE | Admit: 2021-11-15 | Discharge: 2021-11-15 | Disposition: A | Discharge status: Home / Self Care | Payer: Medicaid Other | Source: Ambulatory Visit | Attending: Family Medicine | Admitting: Family Medicine

## 2021-11-15 ENCOUNTER — Other Ambulatory Visit: Payer: Self-pay | Admitting: Family Medicine

## 2021-11-15 ENCOUNTER — Ambulatory Visit (INDEPENDENT_AMBULATORY_CARE_PROVIDER_SITE_OTHER): Payer: Medicaid Other | Admitting: Obstetrics and Gynecology

## 2021-11-15 ENCOUNTER — Encounter: Payer: Self-pay | Admitting: Obstetrics and Gynecology

## 2021-11-15 VITALS — BP 110/69 | HR 62 | Wt 147.1 lb

## 2021-11-15 DIAGNOSIS — O021 Missed abortion: Secondary | ICD-10-CM

## 2021-11-15 DIAGNOSIS — O208 Other hemorrhage in early pregnancy: Secondary | ICD-10-CM | POA: Diagnosis not present

## 2021-11-15 DIAGNOSIS — O3680X0 Pregnancy with inconclusive fetal viability, not applicable or unspecified: Secondary | ICD-10-CM | POA: Insufficient documentation

## 2021-11-15 DIAGNOSIS — Z3A01 Less than 8 weeks gestation of pregnancy: Secondary | ICD-10-CM

## 2021-11-15 DIAGNOSIS — Z3687 Encounter for antenatal screening for uncertain dates: Secondary | ICD-10-CM | POA: Insufficient documentation

## 2021-11-15 HISTORY — DX: Missed abortion: O02.1

## 2021-11-15 NOTE — Progress Notes (Signed)
Obstetrics and Gynecology Visit ?Return Patient Evaluation ? ?Appointment Date: 11/15/2021 ? ?Primary Care Provider: Cora Avery ? ?OB[facility]N Clinic: [facility] ? ?Chief Complaint: add on for viability u/s results ? ?History of Present Illness:  Erin Avery is a 36 y.o. G2P1011 with above CC. Patient had viaibility u/s today that shows embryonic demise: 65mm CRL, 7/1wk by CRL and no cardiac motion. She is 8/0 by her sure LMP and she had a 3/13 u/s in the hospital with an MSD of 5/5 weeks ? ?She denies any cramping, pain or VB or spotting. ? ?Review of Systems: as noted in the History of Present Illness. ? ?Medications:  ?Erin Avery had no medications administered during this visit. ?Current Outpatient Medications  ?Medication Sig Dispense Refill  ? Prenatal Vit-Fe Fumarate-FA (MULTIVITAMIN-PRENATAL) 27-0.8 MG TABS tablet Take 1 tablet by mouth daily at 12 noon.    ? acetaminophen (TYLENOL) 500 MG tablet Take 500 mg by mouth every 6 (six) hours as needed. (Patient not taking: Reported on 11/15/2021)    ? ?No current facility-administered medications for this visit.  ? ? ?Allergies: has No Known Allergies. ? ?Physical Exam:  ?BP 110/69   Pulse 62   Wt 147 lb 1.6 oz (66.7 kg)   LMP 09/20/2021 (Exact Date)   BMI 32.98 kg/m?  Body mass index is 32.98 kg/m?. ?General appearance: Well nourished, well developed female in no acute distress.  ?Neuro/Psych:  Normal mood and affect.  ? ? ?Labs: ?AB POS ? ?  Latest Ref Rng & Units 10/25/2021  ? 11:46 AM 10/25/2021  ?  9:52 AM 07/17/2020  ? 10:34 AM  ?CBC  ?WBC 4.0 - 10.5 K/uL 7.5   7.5   6.9    ?Hemoglobin 12.0 - 15.0 g/dL 16.1   09.6   04.5    ?Hematocrit 36.0 - 46.0 % 38.4   36.3   39.1    ?Platelets 150 - 400 K/uL 249   267   256    ? ?Radiology: ?Narrative & Impression  ?CLINICAL DATA:  Inconclusive fetal viability ?  ?EXAM: ?TRANSVAGINAL OB ULTRASOUND ?  ?TECHNIQUE: ?Transvaginal ultrasound was performed for complete  evaluation of the ?gestation as well as the maternal uterus, adnexal regions, and ?pelvic cul-de-sac. ?  ?COMPARISON:  Sonogram dated October 25, 2021 ?  ?FINDINGS: ?Intrauterine gestational sac: Single ?  ?Yolk sac:  Visualized. ?  ?Embryo:  Visualized. ?  ?Cardiac Activity: Not Visualized. ?  ?CRL:   11  mm   7 w 1 d ?  ?Subchorionic hemorrhage:  Small ?  ?Maternal uterus/adnexae: Normal. ?  ?IMPRESSION: ?1. Crown-rump length of 11 mm corresponding to a gestational age of ?7 weeks and 1 day without embryonic heart activity. ?  ?Findings meet definitive criteria for failed pregnancy. This follows ?SRU consensus guidelines: Diagnostic Criteria for Nonviable ?Pregnancy Early in the First Trimester. N Engl J Med ?[phone]. ?  ?2.  Uterus and adnexa are unremarkable. ?  ?  ?Electronically Signed ?  By: Larose Hires D.O. ?  On: 11/15/2021 10:46  ? ?Assessment: pt stable ? ?Plan: ? ?1. Missed abortion ?Images reviewed and I told her that they're good images and they are c/w embryonic demise. Condolescences given to her and partner and I told them that I recommend d&c for management. They would like to repeat the u/s in a week, which I told them is reasonable and SAB ED precautions given. I told them that miscarriage is very common and  occurs in approx 1/4 to 1/3 women and is usually due to a random, sporadic genetic issue and shouldn't impact fertility or pregnancy in the future.  ?4/10 repeat u/s and MD visit scheduled.  ?- US OB Transvaginal; Future ? ? ?RTC: 1wk for repeat viability u/s and md visit ? ?Cornelia Copa MD ?Attending ?[facility]) ? ? ?

## 2021-11-15 NOTE — Patient Instructions (Signed)
Contact a health care provider if: ?You have a fever or chills. ?There is bad-smelling fluid coming from your vagina. ?You have more bleeding instead of less. ?Tissue or blood clots come out of your vagina. ?Get help right away if: ?You have severe cramps or pain in your back or abdomen. ?Heavy bleeding soaks through 2 large sanitary pads an hour for more than 2 hours. ?You become light-headed or weak. ?You faint. ?You feel sad, and your sadness takes over your thoughts. ?You think about hurting yourself. ?

## 2021-11-22 ENCOUNTER — Ambulatory Visit (INDEPENDENT_AMBULATORY_CARE_PROVIDER_SITE_OTHER): Payer: Medicaid Other | Admitting: Obstetrics and Gynecology

## 2021-11-22 ENCOUNTER — Ambulatory Visit
Admission: RE | Admit: 2021-11-22 | Discharge: 2021-11-22 | Disposition: A | Discharge status: Home / Self Care | Payer: Medicaid Other | Source: Ambulatory Visit | Attending: Obstetrics and Gynecology | Admitting: Obstetrics and Gynecology

## 2021-11-22 VITALS — BP 108/60 | HR 81 | Wt 146.0 lb

## 2021-11-22 DIAGNOSIS — O021 Missed abortion: Secondary | ICD-10-CM | POA: Insufficient documentation

## 2021-11-22 DIAGNOSIS — O208 Other hemorrhage in early pregnancy: Secondary | ICD-10-CM | POA: Diagnosis not present

## 2021-11-22 DIAGNOSIS — Z3A01 Less than 8 weeks gestation of pregnancy: Secondary | ICD-10-CM | POA: Diagnosis not present

## 2021-11-22 DIAGNOSIS — Z01818 Encounter for other preprocedural examination: Secondary | ICD-10-CM

## 2021-11-22 NOTE — Progress Notes (Signed)
?OB/GYN Pre-Op History and Physical ? ?Erin Avery is a 35 y.o. G2P1001 presenting for discussion of missed miscarriage.  Pt had an ultrasound last week that showed 7 week  fetus with no cardiac motion.  Pt requested repeat scan in 1 week  to again check for viability.  Repeat scan today again did not show viability.  Discussed options for treatment including expectant management, medical treatment with misoprostol or surgical intervention.  Pt and spouse have decided on surgical management with D and E. ? ?  ? ? ?No past medical history on file. ? ?Past Surgical History:  ?Procedure Laterality Date  ? CESAREAN SECTION    ? ? ?OB History  ?Gravida Para Term Preterm AB Living  ?2 1 1     1  ?SAB IAB Ectopic Multiple Live Births  ?        1  ?  ?# Outcome Date GA Lbr Len/2nd Weight Sex Delivery Anes PTL Lv  ?2 Current           ?1 Term 01/01/18 [redacted]w[redacted]d  8 lb 8 oz (3.856 kg) M CS-Unspec Gen N LIV  ?   Complications: Failure to Progress in Second Stage  ? ? ?Social History  ? ?Socioeconomic History  ? Marital status: Married  ?  Spouse name: Not on file  ? Number of children: Not on file  ? Years of education: Not on file  ? Highest education level: Not on file  ?Occupational History  ? Not on file  ?Tobacco Use  ? Smoking status: Never  ? Smokeless tobacco: Never  ?Vaping Use  ? Vaping Use: Never used  ?Substance and Sexual Activity  ? Alcohol use: Never  ? Drug use: Never  ? Sexual activity: Yes  ?  Birth control/protection: None  ?Other Topics Concern  ? Not on file  ?Social History Narrative  ? Not on file  ? ?Social Determinants of Health  ? ?Financial Resource Strain: Not on file  ?Food Insecurity: Not on file  ?Transportation Needs: Not on file  ?Physical Activity: Not on file  ?Stress: Not on file  ?Social Connections: Not on file  ? ? ?Family History  ?Problem Relation Age of Onset  ? Diabetes Mother   ? Hypertension Mother   ? Asthma Father   ? ? ?(Not in a hospital admission) ? ? ?No Known  Allergies ? ?Review of Systems: Negative except for what is mentioned in HPI. ? ?  ? ?Physical Exam: ?BP 108/60   Pulse 81   Wt 146 lb (66.2 kg)   LMP 09/20/2021 (Exact Date)   BMI 32.73 kg/m?  ?CONSTITUTIONAL: Well-developed, well-nourished female in no acute distress.  ?HENT:  Normocephalic, atraumatic, External right and left ear normal. Oropharynx is clear and moist ?EYES: Conjunctivae and EOM are normal. Pupils are equal, round, and reactive to light. No scleral icterus.  ?NECK: Normal range of motion, supple, no masses ?SKIN: Skin is warm and dry. No rash noted. Not diaphoretic. No erythema. No pallor. ?NEUROLGIC: Alert and oriented to person, place, and time. Normal reflexes, muscle tone coordination. No cranial nerve deficit noted. ?PSYCHIATRIC: Normal mood and affect. Normal behavior. Normal judgment and thought content. ?CARDIOVASCULAR: Normal heart rate noted, regular rhythm ?RESPIRATORY: Effort and breath sounds normal, no problems with respiration noted ?ABDOMEN: Soft, nontender, nondistended. ?PELVIC: Deferred ?MUSCULOSKELETAL: Normal range of motion. No edema and no tenderness. 2+ distal pulses. ? ?CLINICAL DATA:  Assess viability, pregnant, missed abortion ?  ?EXAM: ?TRANSVAGINAL OB ULTRASOUND ?  ?  TECHNIQUE: ?Transvaginal ultrasound was performed for complete evaluation of the ?gestation as well as the maternal uterus, adnexal regions, and ?pelvic cul-de-sac. ?  ?COMPARISON:  11/15/2021 ?  ?FINDINGS: ?Intrauterine gestational sac: Present, single ?  ?Yolk sac:  Not identified ?  ?Embryo:  Present ?  ?Cardiac Activity: Not identified ?  ?Heart Rate: N/A bpm ?  ?CRL:   11.7 mm   7 w 3 d                  US EDC: 07/08/2022 ?  ?Subchorionic hemorrhage:  Small subchorionic hemorrhage x2 ?  ?Maternal uterus/adnexae: ?  ?Remainder of uterus unremarkable. ?  ?Ovaries normal appearance. ?  ?No free pelvic fluid or adnexal masses. ?  ?IMPRESSION: ?Intrauterine gestational sac containing a fetal pole. ?   ?No fetal cardiac activity identified. ?  ?Small subchronic hemorrhage x2. ?  ?Findings meet definitive criteria for failed pregnancy. This follows ?SRU consensus guidelines: Diagnostic Criteria for Nonviable ?Pregnancy Early in the First Trimester. N Engl J Med ?2013;369:1443-51. ? ?Pertinent Labs/Studies:   ?No results found for this or any previous visit (from the past 72 hour(s)). ? ?  ? ?Assessment and Plan :Erin Avery is a 35 y.o. G2P1001 here for missed miscarriage. ? ? ?Plan for suction D and E ?NPO ?Admission labs ordered ?VS Q4 ?Message has been sent for scheduling of procedure. ? ? ?Hannan Tetzlaff, M.D. ?Attending Obstetrician & Gynecologist, Faculty Practice ?[facility]  ?

## 2021-11-22 NOTE — H&P (View-Only) (Signed)
?OB/GYN Pre-Op History and Physical ? ?Erin Avery is a 36 y.o. G2P1001 presenting for discussion of missed miscarriage.  Pt had an ultrasound last week that showed 7 week  fetus with no cardiac motion.  Pt requested repeat scan in 1 week  to again check for viability.  Repeat scan today again did not show viability.  Discussed options for treatment including expectant management, medical treatment with misoprostol or surgical intervention.  Pt and spouse have decided on surgical management with D and E. ? ?  ? ? ?No past medical history on file. ? ?Past Surgical History:  ?Procedure Laterality Date  ? CESAREAN SECTION    ? ? ?OB History  ?Gravida Para Term Preterm AB Living  ?2 1 1     1   ?SAB IAB Ectopic Multiple Live Births  ?        1  ?  ?# Outcome Date GA Lbr Len/2nd Weight Sex Delivery Anes PTL Lv  ?2 Current           ?1 Term 01/01/18 [redacted]w[redacted]d  8 lb 8 oz (3.856 kg) M CS-Unspec Gen N LIV  ?   Complications: Failure to Progress in Second Stage  ? ? ?Social History  ? ?Socioeconomic History  ? Marital status: Married  ?  Spouse name: Not on file  ? Number of children: Not on file  ? Years of education: Not on file  ? Highest education level: Not on file  ?Occupational History  ? Not on file  ?Tobacco Use  ? Smoking status: Never  ? Smokeless tobacco: Never  ?Vaping Use  ? Vaping Use: Never used  ?Substance and Sexual Activity  ? Alcohol use: Never  ? Drug use: Never  ? Sexual activity: Yes  ?  Birth control/protection: None  ?Other Topics Concern  ? Not on file  ?Social History Narrative  ? Not on file  ? ?Social Determinants of Health  ? ?Financial Resource Strain: Not on file  ?Food Insecurity: Not on file  ?Transportation Needs: Not on file  ?Physical Activity: Not on file  ?Stress: Not on file  ?Social Connections: Not on file  ? ? ?Family History  ?Problem Relation Age of Onset  ? Diabetes Mother   ? Hypertension Mother   ? Asthma Father   ? ? ?(Not in a hospital admission) ? ? ?No Known  Allergies ? ?Review of Systems: Negative except for what is mentioned in HPI. ? ?  ? ?Physical Exam: ?BP 108/60   Pulse 81   Wt 146 lb (66.2 kg)   LMP 09/20/2021 (Exact Date)   BMI 32.73 kg/m?  ?CONSTITUTIONAL: Well-developed, well-nourished female in no acute distress.  ?HENT:  Normocephalic, atraumatic, External right and left ear normal. Oropharynx is clear and moist ?EYES: Conjunctivae and EOM are normal. Pupils are equal, round, and reactive to light. No scleral icterus.  ?NECK: Normal range of motion, supple, no masses ?SKIN: Skin is warm and dry. No rash noted. Not diaphoretic. No erythema. No pallor. ?[place]: Alert and oriented to person, place, and time. Normal reflexes, muscle tone coordination. No cranial nerve deficit noted. ?PSYCHIATRIC: Normal mood and affect. Normal behavior. Normal judgment and thought content. ?CARDIOVASCULAR: Normal heart rate noted, regular rhythm ?RESPIRATORY: Effort and breath sounds normal, no problems with respiration noted ?ABDOMEN: Soft, nontender, nondistended. ?PELVIC: Deferred ?MUSCULOSKELETAL: Normal range of motion. No edema and no tenderness. 2+ distal pulses. ? ?CLINICAL DATA:  Assess viability, pregnant, missed abortion ?  ?EXAM: ?TRANSVAGINAL OB ULTRASOUND ?  ?  TECHNIQUE: ?Transvaginal ultrasound was performed for complete evaluation of the ?gestation as well as the maternal uterus, adnexal regions, and ?pelvic cul-de-sac. ?  ?COMPARISON:  11/15/2021 ?  ?FINDINGS: ?Intrauterine gestational sac: Present, single ?  ?Yolk sac:  Not identified ?  ?Embryo:  Present ?  ?Cardiac Activity: Not identified ?  ?Heart Rate: N/A bpm ?  ?CRL:   11.7 mm   7 w 3 d                  [place] EDC: 07/08/2022 ?  ?Subchorionic hemorrhage:  Small subchorionic hemorrhage x2 ?  ?Maternal uterus/adnexae: ?  ?Remainder of uterus unremarkable. ?  ?Ovaries normal appearance. ?  ?No free pelvic fluid or adnexal masses. ?  ?IMPRESSION: ?Intrauterine gestational sac containing a fetal pole. ?   ?No fetal cardiac activity identified. ?  ?Small subchronic hemorrhage x2. ?  ?Findings meet definitive criteria for failed pregnancy. This follows ?SRU consensus guidelines: Diagnostic Criteria for Nonviable ?Pregnancy Early in the First Trimester. N Engl J Med ?[phone]. ? ?Pertinent Labs/Studies:   ?No results found for this or any previous visit (from the past 72 hour(s)). ? ?  ? ?Assessment and Plan :Erin Avery is a 36 y.o. G2P1001 here for missed miscarriage. ? ? ?Plan for suction D and E ?NPO ?Admission labs ordered ?VS Q4 ?Message has been sent for scheduling of procedure. ? ? ?Lynnda Shields, M.D. ?Attending [place], Faculty Practice ?[facility] [place]  ?

## 2021-11-23 ENCOUNTER — Telehealth: Payer: Self-pay

## 2021-11-23 ENCOUNTER — Encounter (HOSPITAL_COMMUNITY): Payer: Self-pay | Admitting: Obstetrics and Gynecology

## 2021-11-23 NOTE — Progress Notes (Signed)
Spoke with pt for pre-op call. Pt denies any cardiac, HTN or diabetes history. ? ?Shower instructions given to pt and she voiced understanding. ?

## 2021-11-23 NOTE — Telephone Encounter (Signed)
Called patient, surgery date, time and preop instructions given. ?

## 2021-11-24 ENCOUNTER — Other Ambulatory Visit: Payer: Self-pay

## 2021-11-24 ENCOUNTER — Ambulatory Visit (HOSPITAL_BASED_OUTPATIENT_CLINIC_OR_DEPARTMENT_OTHER): Payer: Medicaid Other | Admitting: Certified Registered Nurse Anesthetist

## 2021-11-24 ENCOUNTER — Encounter (HOSPITAL_COMMUNITY)
Admission: RE | Disposition: A | Discharge status: Home / Self Care | Payer: Self-pay | Source: Home / Self Care | Attending: Obstetrics and Gynecology

## 2021-11-24 ENCOUNTER — Ambulatory Visit (HOSPITAL_COMMUNITY): Payer: Medicaid Other | Admitting: Certified Registered Nurse Anesthetist

## 2021-11-24 ENCOUNTER — Ambulatory Visit (HOSPITAL_COMMUNITY)
Admission: RE | Admit: 2021-11-24 | Discharge: 2021-11-24 | Disposition: A | Discharge status: Home / Self Care | Payer: Medicaid Other | Attending: Obstetrics and Gynecology | Admitting: Obstetrics and Gynecology

## 2021-11-24 ENCOUNTER — Encounter (HOSPITAL_COMMUNITY): Payer: Self-pay | Admitting: Obstetrics and Gynecology

## 2021-11-24 DIAGNOSIS — O021 Missed abortion: Secondary | ICD-10-CM | POA: Diagnosis not present

## 2021-11-24 DIAGNOSIS — Z01818 Encounter for other preprocedural examination: Secondary | ICD-10-CM

## 2021-11-24 HISTORY — PX: DILATION AND EVACUATION: SHX1459

## 2021-11-24 HISTORY — DX: Gastro-esophageal reflux disease without esophagitis: K21.9

## 2021-11-24 LAB — CBC
HCT: 35.4 % — ABNORMAL LOW (ref 36.0–46.0)
Hemoglobin: 11.7 g/dL — ABNORMAL LOW (ref 12.0–15.0)
MCH: 27.9 pg (ref 26.0–34.0)
MCHC: 33.1 g/dL (ref 30.0–36.0)
MCV: 84.5 fL (ref 80.0–100.0)
Platelets: 220 10*3/uL (ref 150–400)
RBC: 4.19 MIL/uL (ref 3.87–5.11)
RDW: 12.9 % (ref 11.5–15.5)
WBC: 7.1 10*3/uL (ref 4.0–10.5)
nRBC: 0 % (ref 0.0–0.2)

## 2021-11-24 LAB — TYPE AND SCREEN
ABO/RH(D): AB POS
Antibody Screen: NEGATIVE

## 2021-11-24 SURGERY — DILATION AND EVACUATION, UTERUS
Anesthesia: General

## 2021-11-24 MED ORDER — MIDAZOLAM HCL 5 MG/5ML IJ SOLN
INTRAMUSCULAR | Status: DC | PRN
  Administered 2021-11-24: 2 mg via INTRAVENOUS

## 2021-11-24 MED ORDER — DEXAMETHASONE SODIUM PHOSPHATE 10 MG/ML IJ SOLN
INTRAMUSCULAR | Status: AC
  Filled 2021-11-24: qty 1

## 2021-11-24 MED ORDER — SOD CITRATE-CITRIC ACID 500-334 MG/5ML PO SOLN
ORAL | Status: AC
  Administered 2021-11-24: 30 mL via ORAL
  Filled 2021-11-24: qty 30

## 2021-11-24 MED ORDER — LIDOCAINE 2% (20 MG/ML) 5 ML SYRINGE
INTRAMUSCULAR | Status: AC
  Filled 2021-11-24: qty 5

## 2021-11-24 MED ORDER — ACETAMINOPHEN 500 MG PO TABS
ORAL_TABLET | ORAL | Status: AC
  Administered 2021-11-24: 1000 mg via ORAL
  Filled 2021-11-24: qty 2

## 2021-11-24 MED ORDER — CHLORHEXIDINE GLUCONATE 0.12 % MT SOLN: 15.0000 mL | Freq: Once | OROMUCOSAL | Status: AC

## 2021-11-24 MED ORDER — IBUPROFEN 600 MG PO TABS: 600.0000 mg | ORAL_TABLET | Freq: Four times a day (QID) | ORAL | 2 refills | Status: DC | PRN

## 2021-11-24 MED ORDER — ORAL CARE MOUTH RINSE: 15.0000 mL | Freq: Once | OROMUCOSAL | Status: AC

## 2021-11-24 MED ORDER — ACETAMINOPHEN 500 MG PO TABS: 1000.0000 mg | ORAL_TABLET | ORAL | Status: AC

## 2021-11-24 MED ORDER — DOXYCYCLINE HYCLATE 100 MG IV SOLR
200.0000 mg | INTRAVENOUS | Status: AC
  Administered 2021-11-24: 200 mg via INTRAVENOUS
  Filled 2021-11-24: qty 200

## 2021-11-24 MED ORDER — KETOROLAC TROMETHAMINE 30 MG/ML IJ SOLN
INTRAMUSCULAR | Status: DC | PRN
  Administered 2021-11-24: 30 mg via INTRAVENOUS

## 2021-11-24 MED ORDER — PROPOFOL 10 MG/ML IV BOLUS
INTRAVENOUS | Status: DC | PRN
  Administered 2021-11-24: 160 mg via INTRAVENOUS

## 2021-11-24 MED ORDER — FENTANYL CITRATE (PF) 100 MCG/2ML IJ SOLN: 25.0000 ug | INTRAMUSCULAR | Status: DC | PRN

## 2021-11-24 MED ORDER — MIDAZOLAM HCL 2 MG/2ML IJ SOLN
INTRAMUSCULAR | Status: AC
  Filled 2021-11-24: qty 2

## 2021-11-24 MED ORDER — CHLORHEXIDINE GLUCONATE 0.12 % MT SOLN
OROMUCOSAL | Status: AC
  Administered 2021-11-24: 15 mL via OROMUCOSAL
  Filled 2021-11-24: qty 15

## 2021-11-24 MED ORDER — LACTATED RINGERS IV SOLN: INTRAVENOUS | Status: DC

## 2021-11-24 MED ORDER — SOD CITRATE-CITRIC ACID 500-334 MG/5ML PO SOLN: 30.0000 mL | ORAL | Status: AC

## 2021-11-24 MED ORDER — LIDOCAINE 2% (20 MG/ML) 5 ML SYRINGE
INTRAMUSCULAR | Status: DC | PRN
  Administered 2021-11-24: 60 mg via INTRAVENOUS

## 2021-11-24 MED ORDER — EPHEDRINE 5 MG/ML INJ
INTRAVENOUS | Status: AC
  Filled 2021-11-24: qty 5

## 2021-11-24 MED ORDER — DEXAMETHASONE SODIUM PHOSPHATE 10 MG/ML IJ SOLN
INTRAMUSCULAR | Status: DC | PRN
  Administered 2021-11-24: 10 mg via INTRAVENOUS

## 2021-11-24 MED ORDER — FENTANYL CITRATE (PF) 100 MCG/2ML IJ SOLN
INTRAMUSCULAR | Status: DC | PRN
  Administered 2021-11-24 (×2): 25 ug via INTRAVENOUS
  Administered 2021-11-24: 50 ug via INTRAVENOUS

## 2021-11-24 MED ORDER — FENTANYL CITRATE (PF) 250 MCG/5ML IJ SOLN
INTRAMUSCULAR | Status: AC
  Filled 2021-11-24: qty 5

## 2021-11-24 MED ORDER — ONDANSETRON HCL 4 MG/2ML IJ SOLN
INTRAMUSCULAR | Status: AC
  Filled 2021-11-24: qty 2

## 2021-11-24 MED ORDER — PHENYLEPHRINE 40 MCG/ML (10ML) SYRINGE FOR IV PUSH (FOR BLOOD PRESSURE SUPPORT)
PREFILLED_SYRINGE | INTRAVENOUS | Status: AC
  Filled 2021-11-24: qty 10

## 2021-11-24 MED ORDER — POVIDONE-IODINE 10 % EX SWAB
2.0000 "application " | Freq: Once | CUTANEOUS | Status: AC
  Administered 2021-11-24: 2 via TOPICAL

## 2021-11-24 MED ORDER — ONDANSETRON HCL 4 MG/2ML IJ SOLN
INTRAMUSCULAR | Status: DC | PRN
  Administered 2021-11-24: 4 mg via INTRAVENOUS

## 2021-11-24 MED ORDER — METHYLERGONOVINE MALEATE 0.2 MG/ML IJ SOLN
INTRAMUSCULAR | Status: DC | PRN
  Administered 2021-11-24: .2 mg via INTRAMUSCULAR

## 2021-11-24 MED ORDER — METHYLERGONOVINE MALEATE 0.2 MG/ML IJ SOLN
INTRAMUSCULAR | Status: AC
  Filled 2021-11-24: qty 1

## 2021-11-24 SURGICAL SUPPLY — 24 items
CATH ROBINSON RED A/P 16FR (CATHETERS) ×2 IMPLANT
DECANTER SPIKE VIAL GLASS SM (MISCELLANEOUS) ×2 IMPLANT
DRSG PAD ABDOMINAL 8X10 ST (GAUZE/BANDAGES/DRESSINGS) ×1 IMPLANT
FILTER UTR ASPR ASSEMBLY (MISCELLANEOUS) ×2 IMPLANT
GLOVE BIO SURGEON STRL SZ8 (GLOVE) ×2 IMPLANT
GLOVE BIOGEL PI IND STRL 7.0 (GLOVE) ×1 IMPLANT
GLOVE BIOGEL PI INDICATOR 7.0 (GLOVE) ×1
GOWN STRL REUS W/ TWL LRG LVL3 (GOWN DISPOSABLE) ×1 IMPLANT
GOWN STRL REUS W/ TWL XL LVL3 (GOWN DISPOSABLE) ×1 IMPLANT
GOWN STRL REUS W/TWL LRG LVL3 (GOWN DISPOSABLE) ×1
GOWN STRL REUS W/TWL XL LVL3 (GOWN DISPOSABLE) ×1
HOSE CONNECTING 18IN BERKELEY (TUBING) ×2 IMPLANT
KIT BERKELEY 1ST TRI 3/8 NO TR (MISCELLANEOUS) ×2 IMPLANT
KIT BERKELEY 1ST TRIMESTER 3/8 (MISCELLANEOUS) ×2 IMPLANT
NS IRRIG 1000ML POUR BTL (IV SOLUTION) ×2 IMPLANT
PACK VAGINAL MINOR WOMEN LF (CUSTOM PROCEDURE TRAY) ×2 IMPLANT
PAD OB MATERNITY 4.3X12.25 (PERSONAL CARE ITEMS) ×2 IMPLANT
SET BERKELEY SUCTION TUBING (SUCTIONS) ×2 IMPLANT
TOWEL GREEN STERILE FF (TOWEL DISPOSABLE) ×4 IMPLANT
UNDERPAD 30X36 HEAVY ABSORB (UNDERPADS AND DIAPERS) ×2 IMPLANT
VACURETTE 10 RIGID CVD (CANNULA) IMPLANT
VACURETTE 7MM CVD STRL WRAP (CANNULA) IMPLANT
VACURETTE 8 RIGID CVD (CANNULA) ×1 IMPLANT
VACURETTE 9 RIGID CVD (CANNULA) IMPLANT

## 2021-11-24 NOTE — Transfer of Care (Signed)
Immediate Anesthesia Transfer of Care Note ? ?Patient: Erin Avery ? ?Procedure(s) Performed: DILATATION AND EVACUATION ? ?Patient Location: PACU ? ?Anesthesia Type:General ? ?Level of Consciousness: awake, alert  and oriented ? ?Airway & Oxygen Therapy: Patient Spontanous Breathing and Patient connected to face mask oxygen ? ?Post-op Assessment: Report given to RN and Post -op Vital signs reviewed and stable ? ?Post vital signs: Reviewed and stable ? ?Last Vitals:  ?Vitals Value Taken Time  ?BP 114/72   ?Temp 36.2 ?C 11/24/21 1112  ?Pulse 76 11/24/21 1112  ?Resp 21 11/24/21 1112  ?SpO2 100 % 11/24/21 1112  ?Vitals shown include unvalidated device data. ? ?Last Pain:  ?Vitals:  ? 11/24/21 0804  ?TempSrc:   ?PainSc: 0-No pain  ?   ? ?  ? ?Complications: No notable events documented. ?

## 2021-11-24 NOTE — Progress Notes (Signed)
Patient is recovered and in phase II receiving IV antibiotics. Vital signs stable. Will continue to monitor.  ? ?Hurshel Keys, RN  ?

## 2021-11-24 NOTE — Op Note (Signed)
Erin Avery ?PROCEDURE DATE: 11/24/2021 ? ?PREOPERATIVE DIAGNOSIS: 7 week missed abortion ?POSTOPERATIVE DIAGNOSIS: The same ?PROCEDURE:     Dilation and Evacuation ?SURGEON:  Lynnda Shields ? ?INDICATIONS: 36 y.o. G2P1001 with MAB at 7.[redacted] weeks gestation, needing surgical completion.  Risks of surgery were discussed with the patient including but not limited to: bleeding which may require transfusion; infection which may require antibiotics; injury to uterus or surrounding organs; need for additional procedures including laparotomy or laparoscopy; possibility of intrauterine scarring which may impair future fertility; and other postoperative/anesthesia complications. Written informed consent was obtained.   ? ?FINDINGS:  A 8 week size uterus, moderate amounts of products of conception, specimen sent to pathology. ? ?ANESTHESIA: General-LMA ?INTRAVENOUS FLUIDS:  900 ml of LR ?UOP: 150 ml ?ESTIMATED BLOOD LOSS:  Less than 75 ml. ?SPECIMENS:  Products of conception sent to pathology ?COMPLICATIONS:  None immediate. ? ?PROCEDURE DETAILS:  The patient received intravenous Doxycycline while in the preoperative area.  She was then taken to the operating room where general anesthesia was administered and was found to be adequate.  After an adequate timeout was performed, she was placed in the dorsal lithotomy position and examined; then prepped and draped in the sterile manner.   Her bladder was catheterized for 150 ml of clear, yellow urine. A vaginal speculum was then placed in the patient's vagina and a single tooth tenaculum was applied to the anterior lip of the cervix.  The cervix was gently dilated to accommodate a 8 mm suction curette that was gently advanced to the uterine fundus. The suction device was then activated and curette slowly rotated to clear the uterus of products of conception.  Suction curettage was done until complete emptying of the uterus was confirmed and a frothy appearance was noted in the  fluid.  A gentle sharp curettage was performed followed by a final suction evacuation.There was minimal bleeding noted and the tenaculum removed with good hemostasis noted.   All instruments were removed from the patient's vagina.  Sponge and instrument counts were correct times two  The patient tolerated the procedure well and was taken to the recovery area extubated, awake, and in stable condition. ? ?The patient will be discharged to home as per PACU criteria.  Routine postoperative instructions given.  She was prescribed  Ibuprofen.  She will follow up in the office in 2-3 weeks for postoperative evaluation ? ?Lynnda Shields, MD, [place] ?Obstetrician Social research officer, government, Faculty Practice ?[facility] ?  ?

## 2021-11-24 NOTE — Anesthesia Preprocedure Evaluation (Signed)
Anesthesia Evaluation  ?Patient identified by MRN, date of birth, ID band ?Patient awake ? ? ? ?Reviewed: ?Allergy & Precautions, NPO status , Patient's Chart, lab work & pertinent test results ? ?Airway ?Mallampati: II ? ?TM Distance: >3 FB ? ? ? ? Dental ?  ?Pulmonary ?neg pulmonary ROS,  ?  ?breath sounds clear to auscultation ? ? ? ? ? ? Cardiovascular ?negative cardio ROS ? ? ?Rhythm:Regular Rate:Normal ? ? ?  ?Neuro/Psych ?negative neurological ROS ?   ? GI/Hepatic ?Neg liver ROS, GERD  ,  ?Endo/Other  ?negative endocrine ROS ? Renal/GU ?negative Renal ROS  ? ?  ?Musculoskeletal ? ? Abdominal ?  ?Peds ? Hematology ?  ?Anesthesia Other Findings ? ? Reproductive/Obstetrics ? ?  ? ? ? ? ? ? ? ? ? ? ? ? ? ?  ?  ? ? ? ? ? ? ? ? ?Anesthesia Physical ?Anesthesia Plan ? ?ASA: 2 ? ?Anesthesia Plan: General  ? ?Post-op Pain Management:   ? ?Induction:  ? ?PONV Risk Score and Plan: Ondansetron, Dexamethasone and Midazolam ? ?Airway Management Planned: LMA ? ?Additional Equipment:  ? ?Intra-op Plan:  ? ?Post-operative Plan: Extubation in OR ? ?Informed Consent: I have reviewed the patients History and Physical, chart, labs and discussed the procedure including the risks, benefits and alternatives for the proposed anesthesia with the patient or authorized representative who has indicated his/her understanding and acceptance.  ? ? ? ?Dental advisory given ? ?Plan Discussed with: CRNA and Anesthesiologist ? ?Anesthesia Plan Comments:   ? ? ? ? ? ? ?Anesthesia Quick Evaluation ? ?

## 2021-11-24 NOTE — Interval H&P Note (Signed)
History and Physical Interval Note: ? ?11/24/2021 ?8:29 AM ? ?Erin Avery  has presented today for surgery, with the diagnosis of MAB.  The various methods of treatment have been discussed with the patient and family. After consideration of risks, benefits and other options for treatment, the patient has consented to  Procedure(s): ?DILATATION AND EVACUATION (N/A) as a surgical intervention.  The patient's history has been reviewed, patient examined, no change in status, stable for surgery.  I have reviewed the patient's chart and labs.  Questions were answered to the patient's satisfaction.   ? ? ?Warden Fillers ? ? ?

## 2021-11-24 NOTE — Anesthesia Procedure Notes (Signed)
Procedure Name: LMA Insertion ?Date/Time: 11/24/2021 10:39 AM ?Performed by: Pearson Grippe, CRNA ?Pre-anesthesia Checklist: Patient identified, Emergency Drugs available, Suction available and Patient being monitored ?Patient Re-evaluated:Patient Re-evaluated prior to induction ?Oxygen Delivery Method: Circle system utilized ?Preoxygenation: Pre-oxygenation with 100% oxygen ?Induction Type: IV induction ?Ventilation: Mask ventilation without difficulty ?LMA: LMA inserted ?LMA Size: 4.0 ?Number of attempts: 1 ?Airway Equipment and Method: Bite block ?Placement Confirmation: positive ETCO2 ?Tube secured with: Tape ?Dental Injury: Teeth and Oropharynx as per pre-operative assessment  ? ? ? ? ?

## 2021-11-24 NOTE — Anesthesia Postprocedure Evaluation (Signed)
Anesthesia Post Note ? ?Patient: Erin Avery ? ?Procedure(s) Performed: DILATATION AND EVACUATION ? ?  ? ?Patient location during evaluation: PACU ?Anesthesia Type: General ?Level of consciousness: awake ?Pain management: pain level controlled ?Vital Signs Assessment: post-procedure vital signs reviewed and stable ?Respiratory status: spontaneous breathing ?Cardiovascular status: stable ?Postop Assessment: no apparent nausea or vomiting ?Anesthetic complications: no ? ? ?No notable events documented. ? ?Last Vitals:  ?Vitals:  ? 11/24/21 1215 11/24/21 1242  ?BP: 114/85 105/75  ?Pulse: 69 72  ?Resp: 18 17  ?Temp:    ?SpO2: 100% 98%  ?  ?Last Pain:  ?Vitals:  ? 11/24/21 1242  ?TempSrc:   ?PainSc: Asleep  ? ? ?  ?  ?  ?  ?  ?  ? ?Eunique Balik ? ? ? ? ?

## 2021-11-25 ENCOUNTER — Encounter (HOSPITAL_COMMUNITY): Payer: Self-pay | Admitting: Obstetrics and Gynecology

## 2021-11-25 LAB — SURGICAL PATHOLOGY

## 2021-12-13 ENCOUNTER — Encounter: Payer: Medicaid Other | Admitting: Family Medicine

## 2021-12-13 ENCOUNTER — Encounter: Payer: Self-pay | Admitting: Obstetrics and Gynecology

## 2021-12-13 ENCOUNTER — Ambulatory Visit (INDEPENDENT_AMBULATORY_CARE_PROVIDER_SITE_OTHER): Payer: Medicaid Other | Admitting: Obstetrics and Gynecology

## 2021-12-13 VITALS — BP 127/88 | HR 76 | Wt 148.3 lb

## 2021-12-13 DIAGNOSIS — Z4889 Encounter for other specified surgical aftercare: Secondary | ICD-10-CM

## 2021-12-13 NOTE — Progress Notes (Signed)
? ? ?  Subjective:  ?  ?Erin Avery is a 36 y.o. female who presents to the clinic status post suction D and E on 11/24/21. The patient is not having any pain.  Eating a regular diet without difficulty. Bowel movements are normal. No other significant postoperative concerns. ? ?The following portions of the patient's history were reviewed and updated as appropriate: allergies, current medications, past family history, past medical history, past social history, past surgical history, and problem list..  Last pap smear was normal on 11/04/21. ? ?Review of Systems ?Pertinent items are noted in HPI. ?  ?Objective:  ? ?BP 127/88   Pulse 76   Wt 67.3 kg   LMP 09/20/2021 (Exact Date)   Breastfeeding Unknown   BMI 33.25 kg/m?  ?Constitutional:  Well-developed, well-nourished female in no acute distress.   ?Skin: Skin is warm and dry, no rash noted, not diaphoretic,no erythema, no pallor.  ?Cardiovascular: Normal heart rate noted  ?Respiratory: Effort and breath sounds normal, no problems with respiration noted  ?Abdomen: Soft, bowel sounds active, non-tender, no abnormal masses  ?Incision: N/a  ?Pelvic:    Normal appearing external genitalia, normal vagina, cervix WNL  ? ?Surgical pathology () ?Consistent with early products of conception ?Assessment:  ? ?Doing well postoperatively.  Operative findings again reviewed. Pathology report discussed. ?  ?Plan:  ? ?1. Continue any current medications. ?2. Wound care discussed. ?3. Activity restrictions: none ?4. Anticipated return to work: now. ?5. Follow up as needed ?6.  Routine preventative health maintenance measures emphasized. ?Please refer to After Visit Summary for other counseling recommendations.  ? ? ?Mariel Aloe, MD, FACOG ?Attending Obstetrician & Gynecologist ?[facility] [employer]  ?

## 2022-01-18 ENCOUNTER — Encounter: Payer: Self-pay | Admitting: *Deleted

## 2022-01-26 ENCOUNTER — Ambulatory Visit (INDEPENDENT_AMBULATORY_CARE_PROVIDER_SITE_OTHER): Payer: Medicaid Other | Admitting: Family Medicine

## 2022-01-26 VITALS — BP 115/70 | HR 88 | Ht <= 58 in | Wt 149.0 lb

## 2022-01-26 DIAGNOSIS — Z3200 Encounter for pregnancy test, result unknown: Secondary | ICD-10-CM

## 2022-01-26 DIAGNOSIS — Z3490 Encounter for supervision of normal pregnancy, unspecified, unspecified trimester: Secondary | ICD-10-CM

## 2022-01-26 LAB — POCT URINE PREGNANCY: Preg Test, Ur: POSITIVE — AB

## 2022-01-26 NOTE — Progress Notes (Signed)
    SUBJECTIVE:   CHIEF COMPLAINT / HPI:   Erin Avery is a 36 yo who presents with stomach pain. Recently had 7 week missed abortion with dilation and evacuation on 4/12. Started about 3 days ago. Also with cramping that started on left sdie and now diffuse across abdomen. Tried taking Tylenol yesterday without much relief. Denies pain with eating. Has had some diarrhea since yesterday. States she may have had fever because body was hot yesterday. No issues with urination. Yesterday showed pregnancy positive. Did have a period of 3-4 days a month ago after procedure. Currently no bleeding. States if she is pregnant would not want to have baby because she wants to give her body time to heal.   PERTINENT  PMH / PSH: Reviewed.   OBJECTIVE:   BP 115/70   Pulse 88   Ht 4\' 8"  (1.422 m)   Wt 149 lb (67.6 kg)   SpO2 100%   BMI 33.41 kg/m    General: alert, NAD CV: RRR no murmurs Resp: CTAB normal WOB GI: soft, non distended. Tender in LLQ without guarding Derm: warm, dry. No edema   ASSESSMENT/PLAN:   Pregnancy Patient with new positive pregnancy test and associated cramping. Denies bleeding. LMP 1 month ago (not sure exactly when). Patient does not desire pregnancy. [facility] and advised patient to follow up with me after being seen there to ensure abdominal pain and cramping has resolved.     , DO [facility]

## 2022-01-26 NOTE — Assessment & Plan Note (Addendum)
Patient with new positive pregnancy test and associated cramping. Denies bleeding. LMP 1 month ago (not sure exactly when). Patient does not desire pregnancy. [facility] and advised patient to follow up with me after being seen there to ensure abdominal pain and cramping has resolved.

## 2022-01-26 NOTE — Patient Instructions (Addendum)
It was great seeing you today!  You were seen for abdominal cramping and your urine pregnancy in the clinic was positive which is likely contributing to your pain.   Since you are not wanting pregnancy at this time you can contact [facility] about [facility] for [facility]: [phone]. [address] [place], [place] [address]  Please check-out at the front desk before leaving the clinic. I'd like to see you back for follow up in the next 2-4 weeks after you have been seen by [facility], but if you need to be seen earlier than that for any new issues we're happy to fit you in, just give [place] a call!  Feel free to call with any questions or concerns at any time, at [phone].   Take care,  Dr. Cora Collum [facility]

## 2022-01-27 ENCOUNTER — Telehealth: Payer: Self-pay | Admitting: Family Medicine

## 2022-01-27 NOTE — Telephone Encounter (Signed)
Contacted patient to check in regarding her positive UPT and abdominal pain. She reports ongoing pelvic cramping and pain, worse in the LLQ. Reports similar symptoms early on in her prior pregnancies. Denies bleeding. I advised going to the [facility] for an ultrasound and labwork to evaluate for possible ectopic pregnancy, given unknown location of this pregnancy. Dating will be unreliable from LMP due to recent missed AB with D&E in April. Explained concern for life-threatening internal bleeding if she has an ectopic pregnancy that ruptures. Advised that just because she had similar pain in prior pregnancies does not guarantee this is not an ectopic this time.  She stated she will talk with her husband. I gave her instructions to go to entrance C to access the [facility] at [facility].  Patient appreciative.  Latrelle Dodrill, MD

## 2022-08-15 NOTE — L&D Delivery Note (Signed)
OB/GYN Faculty Practice Delivery Note  Erin Avery is a 38 y.o. Z6X0960 s/p SVD at [redacted]w[redacted]d. She was admitted for SROM TOLAC.   ROM: 8h 18m with clear fluid GBS Status:  Negative/-- (11/20 0445) Maximum Maternal Temperature: 101.73F  Labor Progress: Initial SVE: 0.5/T/-3. She then progressed to complete.   Delivery Date/Time: 0119 11/28 Delivery: Called to room and patient was complete and pushing. Head delivered ROA. No nuchal cord present. Shoulder and body delivered in usual fashion. Infant with spontaneous cry, placed on mother's abdomen, dried and stimulated. Cord clamped x 2 after 1-minute delay, and cut by FOB. Cord blood drawn. Placenta delivered spontaneously with gentle cord traction. Fundus firm with massage and Pitocin. Labia, perineum, vagina, and cervix inspected with 3C degree laceration repaired in usual running fashion after confirming on DRE that no integrity of rectum compromised.  Mom and baby doing well. After closure of perineal laceration patient reminded writer about PP IUD insertion, SVE revealed uterus and internal os tightened beyond ability to insert the IUD.  My apologies were made to the patient and they were understanding and stated "as long as me and baby are good we are happy."  We will continue to counsel on contraception options.  Baby Weight: pending  Placenta: 3 vessel, intact. Sent to pathology given chorioamnionitis  Complications: Chorioamnionitis, 3C perineal laceration Lacerations: as above EBL: 357 mL Analgesia: Epidural   Infant:  APGAR (1 MIN): 7  APGAR (5 MINS): 9   Hessie Dibble, MD [facility] Family Medicine Fellow, [facility] for [facility], [facility] 07/13/2023, 3:00 AM

## 2022-12-04 ENCOUNTER — Telehealth: Payer: Self-pay | Admitting: Student

## 2022-12-04 ENCOUNTER — Inpatient Hospital Stay (HOSPITAL_COMMUNITY)
Admission: AD | Admit: 2022-12-04 | Discharge: 2022-12-04 | Disposition: A | Discharge status: Home / Self Care | Payer: Medicaid Other | Attending: Obstetrics and Gynecology | Admitting: Obstetrics and Gynecology

## 2022-12-04 ENCOUNTER — Other Ambulatory Visit: Payer: Self-pay

## 2022-12-04 ENCOUNTER — Encounter (HOSPITAL_COMMUNITY): Payer: Self-pay | Admitting: Obstetrics and Gynecology

## 2022-12-04 ENCOUNTER — Inpatient Hospital Stay (HOSPITAL_COMMUNITY): Payer: Medicaid Other

## 2022-12-04 DIAGNOSIS — O209 Hemorrhage in early pregnancy, unspecified: Secondary | ICD-10-CM | POA: Insufficient documentation

## 2022-12-04 DIAGNOSIS — O26891 Other specified pregnancy related conditions, first trimester: Secondary | ICD-10-CM | POA: Diagnosis not present

## 2022-12-04 DIAGNOSIS — B3731 Acute candidiasis of vulva and vagina: Secondary | ICD-10-CM | POA: Diagnosis not present

## 2022-12-04 DIAGNOSIS — O26851 Spotting complicating pregnancy, first trimester: Secondary | ICD-10-CM

## 2022-12-04 DIAGNOSIS — O23592 Infection of other part of genital tract in pregnancy, second trimester: Secondary | ICD-10-CM | POA: Diagnosis not present

## 2022-12-04 DIAGNOSIS — Z3A01 Less than 8 weeks gestation of pregnancy: Secondary | ICD-10-CM | POA: Diagnosis not present

## 2022-12-04 DIAGNOSIS — O98811 Other maternal infectious and parasitic diseases complicating pregnancy, first trimester: Secondary | ICD-10-CM | POA: Diagnosis not present

## 2022-12-04 LAB — CBC
HCT: 37.9 % (ref 36.0–46.0)
Hemoglobin: 12.7 g/dL (ref 12.0–15.0)
MCH: 27.2 pg (ref 26.0–34.0)
MCHC: 33.5 g/dL (ref 30.0–36.0)
MCV: 81.2 fL (ref 80.0–100.0)
Platelets: 267 10*3/uL (ref 150–400)
RBC: 4.67 MIL/uL (ref 3.87–5.11)
RDW: 13.1 % (ref 11.5–15.5)
WBC: 7.4 10*3/uL (ref 4.0–10.5)
nRBC: 0 % (ref 0.0–0.2)

## 2022-12-04 LAB — HCG, QUANTITATIVE, PREGNANCY: hCG, Beta Chain, Quant, S: 83877 m[IU]/mL — ABNORMAL HIGH (ref <5)

## 2022-12-04 LAB — WET PREP, GENITAL
Clue Cells Wet Prep HPF POC: NONE SEEN
Sperm: NONE SEEN
Trich, Wet Prep: NONE SEEN
WBC, Wet Prep HPF POC: >=10 — AB (ref <10)

## 2022-12-04 LAB — POCT PREGNANCY, URINE: Preg Test, Ur: POSITIVE — AB

## 2022-12-04 MED ORDER — TERCONAZOLE 0.4 % VA CREA: 1.0000 | TOPICAL_CREAM | Freq: Every day | VAGINAL | 0 refills | Status: DC

## 2022-12-04 NOTE — MAU Note (Signed)
Erin Avery is a 37 y.o. at Unknown here in MAU reporting: she had a +HPT and began having VB with wiping yesterday.  Endorses lower abdominal cramping and back pain. LMP: 10/14/2022 Onset of complaint: yesterday Pain score: 3 Vitals:   12/04/22 0714  BP: 115/76  Pulse: 83  Resp: 18  Temp: 98.6 F (37 C)  SpO2: 100%     FHT:NA Lab orders placed from triage:   UPT & UA

## 2022-12-04 NOTE — Telephone Encounter (Signed)
**  After Hours/ Emergency Line Call**  Received a page to call [phone].  Patient: [name]  Caller: Husband of patient Confirmed name & DOB of patient with caller  Subjective:  Patient husband calls after hours line asking for guidance. Patient is pregnant and has been having vaginal bleeding.  Patient had this before and had to get a d/c for this.  Objective:     Assessment & Plan  Amaryah Cassell is a 37 y.o. female who is pregnant who is having vaginal bleeding. Given symptoms and pregnancy w/ history of d/c for pregnancy, high concern for patient/baby well being. Husband directed to take patient to MAU to be assessed. Husband understood and in agreement.  Recommendations:  Go to MAU   -- Red flags discussed.   -- Will forward to PCP.  Bess Kinds, MD [facility] Family Medicine Residency, PGY-1

## 2022-12-04 NOTE — Discharge Instructions (Signed)

## 2022-12-04 NOTE — MAU Provider Note (Addendum)
Chief Complaint:  Vaginal Bleeding  HPI  Event Date/Time  First Provider Initiated Contact with Patient 12/04/22 0746     Latash Fialkowski is a 37 y.o. G4P1021 at [redacted]w[redacted]d who presents to maternity admissions reporting LMP of 10/14/2022 with a positive HPT. Yesterday morning she noted brown discharge, this morning she had two episodes of bright red with wiping, it has since gone back to brown. Having some mild lower abdominal cramping as well. Has a history of two prior early miscarriages in April and June 2023, is concerned something is wrong this time. No other physical complaints.  Pregnancy Course: Sees [facility] for primary and gyn care, D&C completed by [facility] physicians. Pt noted that when she found out she was pregnant in June 2023, they went "somewhere to get medicine to stop it" because "it was too close to the last pregnancy" but were told there was no longer a pregnancy.  Past Medical History:  Diagnosis Date   GERD (gastroesophageal reflux disease)    OB History  Gravida Para Term Preterm AB Living  SAB IAB Ectopic Multiple Live Births  2 0     1    # Outcome Date GA Lbr Len/2nd Weight Sex Delivery Anes PTL Lv  4 Current           3 SAB 01/2022          2 SAB 11/2021          1 Term 01/01/18 [redacted]w[redacted]d  3856 g M CS-Unspec Gen N LIV     Complications: Failure to Progress in Second Stage   Past Surgical History:  Procedure Laterality Date   CESAREAN SECTION     DILATION AND EVACUATION N/A 11/24/2021   Procedure: DILATATION AND EVACUATION;  Surgeon: Warden Fillers, MD;  Location: [facility] OR;  Service: Gynecology;  Laterality: N/A;   Family History  Problem Relation Age of Onset   Diabetes Mother    Hypertension Mother    Asthma Father    Social History   Tobacco Use   Smoking status: Never   Smokeless tobacco: Never  Vaping Use   Vaping Use: Never used  Substance Use Topics   Alcohol use: Never   Drug use: Never   No Known Allergies Medications Prior  to Admission  Medication Sig Dispense Refill Last Dose   ibuprofen (ADVIL) 600 MG tablet Take 1 tablet (600 mg total) by mouth every 6 (six) hours as needed for headache, mild pain, moderate pain or cramping. 30 tablet 2    I have reviewed patient's Past Medical Hx, Surgical Hx, Family Hx, Social Hx, medications and allergies.   ROS  Pertinent items noted in HPI and remainder of comprehensive ROS otherwise negative.   PHYSICAL EXAM  Patient Vitals for the past 24 hrs:  BP Temp Temp src Pulse Resp SpO2 Height Weight  12/04/22 0714 115/76 98.6 F (37 C) Oral 83 18 100 % -- --  12/04/22 0708 -- -- -- -- -- --  (1.422 m) 66.9 kg   Constitutional: Well-developed, well-nourished female in no acute distress.  Cardiovascular: normal rate & rhythm, warm and well-perfused Respiratory: normal effort, no problems with respiration noted GI: Abd soft, non-tender, non-distended MS: Extremities nontender, no edema, normal ROM Neurologic: Alert and oriented x 4.  GU: no CVA tenderness Pelvic: exam deferred, pt self-collected swabs and went to ultrasound  Labs: Results for orders placed or performed during the hospital encounter of  12/04/22 (from the past 24 hour(s))  Pregnancy, urine POC     Status: Abnormal   Collection Time: 12/04/22  7:16 AM  Result Value Ref Range   Preg Test, Ur POSITIVE (A) NEGATIVE  Wet prep, genital     Status: Abnormal   Collection Time: 12/04/22  7:38 AM  Result Value Ref Range   Yeast Wet Prep HPF POC PRESENT (A) NONE SEEN   Trich, Wet Prep NONE SEEN NONE SEEN   Clue Cells Wet Prep HPF POC NONE SEEN NONE SEEN   WBC, Wet Prep HPF POC >=10 (A) <10   Sperm NONE SEEN   CBC     Status: None   Collection Time: 12/04/22  7:46 AM  Result Value Ref Range   WBC 7.4 4.0 - 10.5 K/uL   RBC 4.67 3.87 - 5.11 MIL/uL   Hemoglobin 12.7 12.0 - 15.0 g/dL   HCT 54.0 98.1 - 19.1 %   MCV 81.2 80.0 - 100.0 fL   MCH 27.2 26.0 - 34.0 pg   MCHC 33.5 30.0 - 36.0 g/dL   RDW 47.8  29.5 - 62.1 %   Platelets 267 150 - 400 K/uL   nRBC 0.0 0.0 - 0.2 %  hCG, quantitative, pregnancy     Status: Abnormal   Collection Time: 12/04/22  7:46 AM  Result Value Ref Range   hCG, Beta Chain, Quant, S 83,877 (H) <5 mIU/mL   Imaging:  US OB LESS THAN 14 WEEKS WITH OB TRANSVAGINAL  Result Date: 12/04/2022 CLINICAL DATA:  Vaginal bleeding in 1st trimester pregnancy. EXAM: OBSTETRIC <14 WK [place] AND TRANSVAGINAL OB US TECHNIQUE: Both transabdominal and transvaginal ultrasound examinations were performed for complete evaluation of the gestation as well as the maternal uterus, adnexal regions, and pelvic cul-de-sac. Transvaginal technique was performed to assess early pregnancy. COMPARISON:  None Available. FINDINGS: Intrauterine gestational sac: Single Yolk sac:  Visualized. Embryo:  Visualized. Cardiac Activity: Visualized. Heart Rate: 123 bpm CRL:  6 mm   6 w   3 d                  [place] EDC: 07/27/2023 Subchorionic hemorrhage:  None visualized. Maternal uterus/adnexae: Both ovaries are normal in appearance. No mass or free fluid identified. IMPRESSION: Single living IUP with estimated gestational age of [redacted] weeks 3 days, and [place] EDC of 07/27/2023. No maternal uterine or adnexal abnormality identified. Electronically Signed   By: Danae Orleans M.D.   On: 12/04/2022 08:28    MDM & MAU COURSE  MDM:  MAU Course: Orders Placed This Encounter  Procedures   Wet prep, genital   US OB LESS THAN 14 WEEKS WITH OB TRANSVAGINAL   Urinalysis, Routine w reflex microscopic -Urine, Clean Catch   CBC   hCG, quantitative, pregnancy   Pregnancy, urine POC   Discharge patient   Meds ordered this encounter  Medications   terconazole (TERAZOL 7) 0.4 % vaginal cream    Sig: Place 1 applicator vaginally at bedtime. Use for seven days    Dispense:  45 g    Refill:  0    Order Specific Question:   Supervising Provider    Answer:   Reva Bores [[id number]]   Pt stable so exam completed in triage - swabs and labs  collected and patient went to u/s. Care turned over to [facility], CNM.  ASSESSMENT   1. Spotting affecting pregnancy in first trimester   2. Candida vaginitis   3. [redacted] weeks gestation  of pregnancy     PLAN  - Information provided on spotting in pregnancy first trimester and pelvic rest - Advised not to engage in SI until cleared by MD at [facility] office - Safe Medications in Pregnancy List given - Advised to stop using Ibuprofen; ok for Tylenol - U/S pictures given and EDC discussed - Rx: Terazol 0.4% vaginal cream pv hs x 7 days - All questions and concerns answered - Discharge home - Patient verbalized an understanding of the plan of care and agrees.   Raelyn Mora, CNM  12/04/2022 10:00 AM

## 2022-12-05 LAB — GC/CHLAMYDIA PROBE AMP (~~LOC~~) NOT AT ARMC
Chlamydia: NEGATIVE
Comment: NEGATIVE
Comment: NORMAL
Neisseria Gonorrhea: NEGATIVE

## 2022-12-08 NOTE — Progress Notes (Signed)
    SUBJECTIVE:   CHIEF COMPLAINT / HPI:   Confirm pregnancy- last LMP unsure, early March, possibly March 4th. Has had some spotting so went to MAU and got early ultrasound. NO large amount of bleeding or clots. Mild cramping from time to time. Last spotting was yesterday, none today.  Denies nausea. Would like to follow at [facility] for OB care.  OB history reviewed- 2019 term pregnancy at [facility] in [place], dilated to complete with epidural but failure of descent with pushing so she has RLTCS, denies other complications 2023- SAB in April and May  PERTINENT  PMH / PSH: none  OBJECTIVE:   BP 105/65   Pulse 79   Wt 149 lb (67.6 kg)   LMP 10/14/2022   SpO2 100%   BMI 33.41 kg/m   General: alert & oriented, no apparent distress, well groomed HEENT: normocephalic, atraumatic, EOM grossly intact, oral mucosa moist, neck supple Respiratory: normal respiratory effort GI: non-distended Skin: no rashes, no jaundice Psych: appropriate mood and affect   ASSESSMENT/PLAN:   Less than [redacted] weeks gestation of pregnancy Dating by early [place] as unsure LMP, confirmed IUP Bleeding precautions given including heavier bleeding or large clots or severe abd pain or changing one or more pads per hour New OB scheduled with myself as pt would like to follow with me for OB care as Dr Idalia Needle is graduating New OB labwork completed today She denies nausea, instructed to call if she starts having nausea     Billey Co, MD [facility]

## 2022-12-09 ENCOUNTER — Ambulatory Visit (INDEPENDENT_AMBULATORY_CARE_PROVIDER_SITE_OTHER): Payer: Medicaid Other | Admitting: Family Medicine

## 2022-12-09 ENCOUNTER — Encounter: Payer: Self-pay | Admitting: Family Medicine

## 2022-12-09 VITALS — BP 105/65 | HR 79 | Wt 149.0 lb

## 2022-12-09 DIAGNOSIS — N912 Amenorrhea, unspecified: Secondary | ICD-10-CM | POA: Diagnosis not present

## 2022-12-09 DIAGNOSIS — Z98891 History of uterine scar from previous surgery: Secondary | ICD-10-CM | POA: Diagnosis not present

## 2022-12-09 DIAGNOSIS — Z3A09 9 weeks gestation of pregnancy: Secondary | ICD-10-CM | POA: Insufficient documentation

## 2022-12-09 DIAGNOSIS — O0990 Supervision of high risk pregnancy, unspecified, unspecified trimester: Secondary | ICD-10-CM | POA: Insufficient documentation

## 2022-12-09 DIAGNOSIS — Z3A01 Less than 8 weeks gestation of pregnancy: Secondary | ICD-10-CM | POA: Diagnosis not present

## 2022-12-09 NOTE — Assessment & Plan Note (Addendum)
Dating by early [place] as unsure LMP, confirmed IUP Bleeding precautions given including heavier bleeding or large clots or severe abd pain or changing one or more pads per hour New OB scheduled with myself as pt would like to follow with me for OB care as Dr Idalia Needle is graduating New OB labwork completed today She denies nausea, instructed to call if she starts having nausea

## 2022-12-09 NOTE — Patient Instructions (Addendum)
It was wonderful to see you today.  Please bring ALL of your medications with you to every visit.   Today we talked about:  Congratulations on your pregnancy! We will do some lab work and have you leave [place] a urine sample today.   We have scheduled you for a new OB appointment with me.   Thank you for choosing [facility].   Please call [phone] with any questions about today's appointment.  Please arrive at least 15 minutes prior to your scheduled appointments.   If you had blood work today, I will send you a MyChart message or a letter if results are normal. Otherwise, I will give you a call.   If you had a referral placed, they will call you to set up an appointment. Please give [place] a call if you don't hear back in the next 2 weeks.   If you need additional refills before your next appointment, please call your pharmacy first.   Burley Saver, MD  Family Medicine     Safe Medications in Pregnancy   Acne:  Benzoyl Peroxide  Salicylic Acid   Backache/Headache:  Tylenol: 2 regular strength every 4 hours OR               2 Extra strength every 6 hours   Colds/Coughs/Allergies:  Benadryl (alcohol free) 25 mg every 6 hours as needed  Breath right strips  Claritin  Cepacol throat lozenges  Chloraseptic throat spray  Cold-Eeze- up to three times per day  Cough drops, alcohol free  Flonase (by prescription only)  Guaifenesin  Mucinex  Robitussin DM (plain only, alcohol free)  Saline nasal spray/drops  Sudafed (pseudoephedrine) & Actifed * use only after [redacted] weeks gestation and if you do not have high blood pressure  Tylenol  Vicks Vaporub  Zinc lozenges  Zyrtec   Constipation:  Colace  Ducolax suppositories  Fleet enema  Glycerin suppositories  Metamucil  Milk of magnesia  Miralax  Senokot  Smooth move tea   Diarrhea:  Kaopectate  Imodium A-D   *NO pepto Bismol   Hemorrhoids:  Anusol  Anusol HC  Preparation H  Tucks    Indigestion:  Tums  Maalox  Mylanta  Zantac  Pepcid   Insomnia:  Benadryl (alcohol free) 25mg  every 6 hours as needed  Tylenol PM  Unisom, no Gelcaps   Leg Cramps:  Tums  MagGel   Nausea/Vomiting:  Bonine  Dramamine  Emetrol  Ginger extract  Sea bands  Meclizine  Nausea medication to take during pregnancy:  Unisom (doxylamine succinate 25 mg tablets) Take one tablet daily at bedtime. If symptoms are not adequately controlled, the dose can be increased to a maximum recommended dose of two tablets daily (1/2 tablet in the morning, 1/2 tablet mid-afternoon and one at bedtime).  Vitamin B6 100mg  tablets. Take one tablet twice a day (up to 200 mg per day).   Skin Rashes:  Aveeno products  Benadryl cream or 25mg  every 6 hours as needed  Calamine Lotion  1% cortisone cream   Yeast infection:  Gyne-lotrimin 7  Monistat 7    **If taking multiple medications, please check labels to avoid duplicating the same active ingredients  **take medication as directed on the label  ** Do not exceed 4000 mg of tylenol in 24 hours  **Do not take medications that contain aspirin or ibuprofen

## 2022-12-10 LAB — CBC/D/PLT+RPR+RH+ABO+RUBIGG...
Hemoglobin: 11.7 g/dL (ref 11.1–15.9)
Hepatitis B Surface Ag: NEGATIVE
MCV: 82 fL (ref 79–97)
Monocytes Absolute: 0.2 10*3/uL (ref 0.1–0.9)
Platelets: 262 10*3/uL (ref 150–450)
RPR Ser Ql: NONREACTIVE
Urobilinogen, Ur: 0.2 mg/dL (ref 0.2–1.0)
WBC: 6.5 10*3/uL (ref 3.4–10.8)

## 2022-12-10 LAB — MICROSCOPIC EXAMINATION

## 2022-12-12 LAB — CBC/D/PLT+RPR+RH+ABO+RUBIGG...
Basophils Absolute: 0 10*3/uL (ref 0.0–0.2)
Basos: 0 %
Bilirubin, UA: NEGATIVE
EOS (ABSOLUTE): 0.1 10*3/uL (ref 0.0–0.4)
Eos: 1 %
Glucose, UA: NEGATIVE
HCV Ab: NONREACTIVE
Hematocrit: 36.5 % (ref 34.0–46.6)
Leukocytes,UA: NEGATIVE
Neutrophils Absolute: 4.3 10*3/uL (ref 1.4–7.0)
RBC, UA: NEGATIVE
RBC: 4.44 x10E6/uL (ref 3.77–5.28)

## 2022-12-12 LAB — URINE CULTURE, OB REFLEX

## 2022-12-14 LAB — MICROSCOPIC EXAMINATION
Bacteria, UA: NONE SEEN
Casts: NONE SEEN /LPF
WBC, UA: NONE SEEN /hpf (ref 0–5)

## 2022-12-14 LAB — CBC/D/PLT+RPR+RH+ABO+RUBIGG...
Antibody Screen: NEGATIVE
HIV Screen 4th Generation wRfx: NONREACTIVE
Immature Grans (Abs): 0 10*3/uL (ref 0.0–0.1)
Immature Granulocytes: 0 %
Ketones, UA: NEGATIVE
Lymphocytes Absolute: 1.9 10*3/uL (ref 0.7–3.1)
Lymphs: 30 %
MCH: 26.4 pg — ABNORMAL LOW (ref 26.6–33.0)
MCHC: 32.1 g/dL (ref 31.5–35.7)
Monocytes: 3 %
Neutrophils: 66 %
Nitrite, UA: NEGATIVE
Protein,UA: NEGATIVE
RDW: 13.2 % (ref 11.7–15.4)
Rh Factor: POSITIVE
Rubella Antibodies, IGG: 6.14 index (ref >0.99)
Specific Gravity, UA: 1.021 (ref 1.005–1.030)
pH, UA: 6.5 (ref 5.0–7.5)

## 2022-12-14 LAB — HCV INTERPRETATION

## 2022-12-16 NOTE — Progress Notes (Unsigned)
Patient Name: Juleanna Isom Date of Birth: 02-11-1986 [facility] Initial Prenatal Visit  Deavon Amsbaugh is a 37 y.o. year old G69P1021 at [redacted]w[redacted]d who presents for her initial prenatal visit. Pregnancy {Is/is not:9024} planned She reports {pregnancy symptoms:18128}. She {is/is not:320031::"is"} taking a prenatal vitamin.  She denies pelvic pain or vaginal bleeding.   Pregnancy Dating: by first trimester [place] as unsure LMP  Lab Review: Blood type: AB Rh Status: + Antibody screen: Negative HIV: Negative RPR: Negative Hemoglobin electrophoresis reviewed: Yes Results of OB urine culture are: Negative Rubella: Immune Hep C Ab: Negative Varicella status is {Desc; immune/not/unknown:31571::"Immune"}  PMH: Reviewed and as detailed below: HTN: {yes/no:20286::"No"}  Gestational Hypertension/preeclampsia: {yes/no:20286::"No"}  Type 1 or 2 Diabetes: {yes/no:20286::"No"}  Depression:  {yes/no:20286::"No"}  Seizure disorder:  {yes/no:20286::"No"} VTE: {yes/no:20286::"No"} ,  History of STI {yes/no:20286::"No"},  Abnormal Pap smear:  {yes/no:20286::"No"}, Genital herpes simplex:  {yes/no:20286::"No"}   PSH: Gynecologic Surgery:  {No/  **:31982:o:"no"} Surgical history reviewed, notable for: ***  Obstetric History: Obstetric history tab updated and reviewed.  2019 term pregnancy at [facility] in [place], dilated to complete with epidural but failure of descent with pushing so she has RLTCS, denies other complications 2023- SAB in April and May Summary of prior pregnancies: as above Cesarean delivery: Yes  Gestational Diabetes:  {yes/no:20286::"No"} Hypertension in pregnancy: {yes/no:20286::"No"} History of preterm birth: {yes/no:20286::"No"} History of LGA/SGA infant:  {yes/no:20286::"No"} History of shoulder dystocia: {yes/no:20286::"No"} Indications for referral were reviewed, and the patient has no obstetric indications for referral to High Risk OB Clinic  at this time.   Social History: Partner's name: ***  Tobacco use: {yes/no:20286::"No"} Alcohol use:  {yes/no:20286::"No"} Other substance use:  {yes/no:20286::"No"}  Current Medications:  ***  Reviewed and appropriate in pregnancy.   Genetic and Infection Screen: Flow Sheet Updated {yes/no:20286::"Yes"}  Prenatal Exam: Gen: Well nourished, well developed.  No distress.  Vitals noted. HEENT: Normocephalic, atraumatic.  Neck supple without cervical lymphadenopathy, thyromegaly or thyroid nodules.  Fair dentition. CV: RRR no murmur, gallops or rubs Lungs: CTA B.  Normal respiratory effort without wheezes or rales. Abd: soft, NTND. +BS.  Uterus not appreciated above pelvis. GU: Normal external female genitalia without lesions.  Nl vaginal, well rugated without lesions. No vaginal discharge.  Bimanual exam: No adnexal mass or TTP. No CMT.  Uterus size *** Ext: No clubbing, cyanosis or edema. Psych: Normal grooming and dress.  Not depressed or anxious appearing.  Normal thought content and process without flight of ideas or looseness of associations  Assessment/Plan:  Shatira Janousek is a 37 y.o. Z6X0960 at [redacted]w[redacted]d who presents to initiate prenatal care. She is doing well.  Current pregnancy issues include ***.  Routine prenatal care: As dating {ACTION; IS/IS NOT:[id number]::"is not"} reliable, a dating ultrasound {HAS HAS NOT:18834::"has"} been ordered. Dating tab updated. Pre-pregnancy weight updated. Expected weight gain this pregnancy is {weight gain pregnancy :23296::"25-35 pounds "} Prenatal labs reviewed, notable for ***. Indications for referral to [facility] were reviewed and the patient {DOES NOT does:27190::"does not"} meet criteria for referral.  Medication list reviewed and updated.  Recommended patient see a dentist for regular care.  Bleeding and pain precautions reviewed. Importance of prenatal vitamins reviewed.  Genetic screening offered. Patient opted for:  {obgeneticscreen:23414}. The patient has the following indications for aspirinto begin 81 mg at 12-16 weeks: One high risk condition: {fmcaspirinobhigh:26167} MORE than one moderate risk condition: {fmcaspirinobmoderate:26168} Aspirin {WAS/WAS [phone]::"was not"}  recommended today based upon above risk factors (one high risk condition or more than one moderate  risk factor)  The patient {will/will not be:23415} age 48 or over at time of delivery. Referral to genetic counseling {WAS/WAS NOT:[phone]::"was not"} offered today.  The patient has the following risk factors for preexisting diabetes: {Pre-existing diabetes screening:23343::"Reviewed indications for early 1 hour glucose testing, not indicated "}. An early 1 hour glucose tolerance test {WAS/WAS NOT:[phone]::"was not"} ordered. Pregnancy Medical Home and PHQ-9 forms completed, problems noted: {yes/no:20286}  2. Pregnancy issues include the following which were addressed today:  ***   Follow up 4 weeks for next prenatal visit.

## 2022-12-19 ENCOUNTER — Encounter: Payer: Self-pay | Admitting: Family Medicine

## 2022-12-19 ENCOUNTER — Ambulatory Visit (INDEPENDENT_AMBULATORY_CARE_PROVIDER_SITE_OTHER): Payer: Medicaid Other | Admitting: Family Medicine

## 2022-12-19 VITALS — BP 102/64 | HR 78 | Wt 146.0 lb

## 2022-12-19 DIAGNOSIS — Z3A01 Less than 8 weeks gestation of pregnancy: Secondary | ICD-10-CM | POA: Diagnosis not present

## 2022-12-19 DIAGNOSIS — Z3481 Encounter for supervision of other normal pregnancy, first trimester: Secondary | ICD-10-CM | POA: Diagnosis not present

## 2022-12-19 NOTE — Patient Instructions (Signed)
It was wonderful to see you today.  Please bring ALL of your medications with you to every visit.   Today we talked about:  We did your new OB visit today, everything is looking good. Call us if you have any concerns or have vaginal spotting or bleeding >1 pad in an hour.  We will see you back in one month!  Thank you for choosing [facility].   Please call [phone] with any questions about today's appointment.  Please arrive at least 15 minutes prior to your scheduled appointments.   If you had blood work today, I will send you a MyChart message or a letter if results are normal. Otherwise, I will give you a call.   If you had a referral placed, they will call you to set up an appointment. Please give [place] a call if you don't hear back in the next 2 weeks.   If you need additional refills before your next appointment, please call your pharmacy first.   Burley Saver, MD  Family Medicine

## 2022-12-19 NOTE — Addendum Note (Signed)
Addended by: Georges Lynch T on: 12/19/2022 10:48 AM   Modules accepted: Orders

## 2022-12-20 ENCOUNTER — Other Ambulatory Visit (INDEPENDENT_AMBULATORY_CARE_PROVIDER_SITE_OTHER): Payer: Medicaid Other

## 2022-12-20 DIAGNOSIS — Z3A01 Less than 8 weeks gestation of pregnancy: Secondary | ICD-10-CM | POA: Diagnosis not present

## 2022-12-20 DIAGNOSIS — Z3491 Encounter for supervision of normal pregnancy, unspecified, first trimester: Secondary | ICD-10-CM | POA: Diagnosis not present

## 2022-12-20 LAB — POCT 1 HR PRENATAL GLUCOSE: Glucose 1 Hr Prenatal, POC: 186 mg/dL

## 2022-12-20 LAB — POCT CBG (FASTING - GLUCOSE)-MANUAL ENTRY: Glucose Fasting, POC: 86 mg/dL (ref 70–99)

## 2022-12-21 ENCOUNTER — Telehealth: Payer: Self-pay | Admitting: Family Medicine

## 2022-12-21 DIAGNOSIS — R7309 Other abnormal glucose: Secondary | ICD-10-CM

## 2022-12-21 LAB — GESTATIONAL GLUCOSE TOLERANCE
Glucose, Fasting: 83 mg/dL (ref 70–94)
Glucose, GTT - 1 Hour: 217 mg/dL — ABNORMAL HIGH (ref 70–179)
Glucose, GTT - 2 Hour: 195 mg/dL — ABNORMAL HIGH (ref 70–154)
Glucose, GTT - 3 Hour: 53 mg/dL — ABNORMAL LOW (ref 70–139)

## 2022-12-21 MED ORDER — ACCU-CHEK GUIDE W/DEVICE KIT
1.0000 | PACK | Freq: Three times a day (TID) | 0 refills | Status: DC
Start: 2022-12-21 — End: 2023-07-15

## 2022-12-21 MED ORDER — ACCU-CHEK SMARTVIEW VI STRP
ORAL_STRIP | 12 refills | Status: DC
Start: 2022-12-21 — End: 2023-07-15

## 2022-12-21 MED ORDER — ACCU-CHEK SOFTCLIX LANCETS MISC
12 refills | Status: DC
Start: 2022-12-21 — End: 2023-07-15

## 2022-12-21 NOTE — Telephone Encounter (Signed)
Patient called back and we discussed the elevated BG results.  Discussed this was consistent with pre-gestational diabetes, and the risks of elevated blood glucose including miscarriage, poor fetal growth, stillbirth, macrosomia.  Discussed glucometer sent to pharmacy and to start checking fasting BG and 2 hours postprandial and bring to follow up appt scheduled on Monday. Discussed dietary and lifestyle changes for diabetes that she can implement.  Answered all questions and concerns. Will review BG log and pre-gestational DM more in depth at appt on Monday 12/26/22.  Goal BG for pre-gestational DM: Fasting BG < 90 2 hr postprandial BG < 120  If > 50% elevated will start metformin.  Would also call [facility] if appt not scheduled by next week.  Burley Saver MD

## 2022-12-21 NOTE — Telephone Encounter (Signed)
Called patient mobile x2, no answer and unable to leave VM.   Called patient home number, left VM discussing elevated 3 hr GTT concerning for pre-gestational diabetes and left clinic number to call back. Discussed I sent glucometer and strips/lancets to pharmacy for her to start checking her BG.  IF patient calls back, please let me know, and please also get her scheduled for appt in next week with any provider to review BG. Will need to check BG weekly and possibly start metformin if > 50% BG are elevated.  Goal BG for pre-gestational DM: Fasting BG < 90 2 hr postprandial BG < 120  Have placed referral to [facility]. Will likely need fetal ECHO.  Burley Saver MD

## 2022-12-24 NOTE — Progress Notes (Unsigned)
   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 Patient was instructed to check FSBG every day both fasting and 2 hrs postprandial.   Today, patient and husband present for follow up. They report they were able to pick up glucometer but not lancets or strips because those were not covered by insurance and would have cost $200. They were able to check her glucose for 3 days using an older [employer] brand glucometer.   They report preprandial 90-94 and 1.5 hours post prandial 144.   Per Dr. Berneta Levins note, if > 50% elevated will start metformin.   [facility] has not yet gotten in touch to make an appointment.  PERTINENT  PMH / PSH:  Patient Active Problem List   Diagnosis Date Noted   Diabetes mellitus (HCC) 12/26/2022   [redacted] weeks gestation of pregnancy 12/09/2022   History of cesarean delivery 12/09/2022   Missed abortion 11/15/2021    OBJECTIVE:   BP 102/70   Pulse 74   Wt 145 lb (65.8 kg)   LMP 10/14/2022   BMI 32.51 kg/m    General: Awake and alert, NAD Respiratory: CTAB Cardiac: Regular rate and rhythm, no murmur  ASSESSMENT/PLAN:   Diabetes mellitus (HCC) Early 3 hr GTT shows likely pregestational diabetes. Patient unable to get all glucometer supplies. Was able to check glucose x 3 days last week, however checked postprandially too early at 1.5 hours instead of 2.  Discussed starting metformin, however patient would like to avoid medicine if possible.  Reports a lot of rice in the diet. - Spoke with pharmacy, changed lancets and strips orders to those covered by Medicaid, patient to pick up on the way home - Patient to check fasting and postprandial sugars every day for the next week - Postprandial should be at the 2-hour mark - Follow-up in 1 week - Goals for glucose: fasting <90, 2 hr postprandial <120...if > 50% elevated will start metformin.     Erin Pho, MD [facility]

## 2022-12-26 ENCOUNTER — Ambulatory Visit (INDEPENDENT_AMBULATORY_CARE_PROVIDER_SITE_OTHER): Payer: Medicaid Other | Admitting: Family Medicine

## 2022-12-26 VITALS — BP 102/70 | HR 74 | Wt 145.0 lb

## 2022-12-26 DIAGNOSIS — Z3481 Encounter for supervision of other normal pregnancy, first trimester: Secondary | ICD-10-CM

## 2022-12-26 DIAGNOSIS — Z3A09 9 weeks gestation of pregnancy: Secondary | ICD-10-CM

## 2022-12-26 DIAGNOSIS — E119 Type 2 diabetes mellitus without complications: Secondary | ICD-10-CM | POA: Insufficient documentation

## 2022-12-26 DIAGNOSIS — E139 Other specified diabetes mellitus without complications: Secondary | ICD-10-CM

## 2022-12-26 DIAGNOSIS — O24919 Unspecified diabetes mellitus in pregnancy, unspecified trimester: Secondary | ICD-10-CM | POA: Insufficient documentation

## 2022-12-26 NOTE — Patient Instructions (Addendum)
It was wonderful to see you today. Thank you for allowing me to be a part of your care. Below is a short summary of what we discussed at your visit today:  Diabetes Please check your blood sugar before you eat.  Please check your blood sugar 2 hours after you eat.  Keep a record and bring it to your next appointment.   COME back in one week WITH your blood sugar log (either on your phone or paper).   High risk OB We will call you with appointment details once we are able to get you an appointment with the high risk OB clinic.   We will care for you at the family medicine clinic until you are able to establish with the high risk OB.     Please bring all of your medications to every appointment! If you have any questions or concerns, please do not hesitate to contact us via phone or MyChart message.   Fayette Pho, MD

## 2022-12-26 NOTE — Assessment & Plan Note (Signed)
Early 3 hr GTT shows likely pregestational diabetes. Patient unable to get all glucometer supplies. Was able to check glucose x 3 days last week, however checked postprandially too early at 1.5 hours instead of 2.  Discussed starting metformin, however patient would like to avoid medicine if possible.  Reports a lot of rice in the diet. - Spoke with pharmacy, changed lancets and strips orders to those covered by Medicaid, patient to pick up on the way home - Patient to check fasting and postprandial sugars every day for the next week - Postprandial should be at the 2-hour mark - Follow-up in 1 week - Goals for glucose: fasting <90, 2 hr postprandial <120...if > 50% elevated will start metformin.

## 2023-01-04 ENCOUNTER — Telehealth: Payer: Self-pay

## 2023-01-04 ENCOUNTER — Other Ambulatory Visit: Payer: Self-pay

## 2023-01-04 ENCOUNTER — Ambulatory Visit (INDEPENDENT_AMBULATORY_CARE_PROVIDER_SITE_OTHER): Payer: Medicaid Other | Admitting: Family Medicine

## 2023-01-04 ENCOUNTER — Encounter: Payer: Self-pay | Admitting: Family Medicine

## 2023-01-04 VITALS — BP 103/71 | HR 80 | Ht <= 58 in | Wt 145.6 lb

## 2023-01-04 DIAGNOSIS — Z3492 Encounter for supervision of normal pregnancy, unspecified, second trimester: Secondary | ICD-10-CM | POA: Diagnosis not present

## 2023-01-04 DIAGNOSIS — E139 Other specified diabetes mellitus without complications: Secondary | ICD-10-CM

## 2023-01-04 DIAGNOSIS — O24911 Unspecified diabetes mellitus in pregnancy, first trimester: Secondary | ICD-10-CM

## 2023-01-04 MED ORDER — METFORMIN HCL ER 500 MG PO TB24: 500.0000 mg | ORAL_TABLET | Freq: Two times a day (BID) | ORAL | 3 refills | Status: DC

## 2023-01-04 NOTE — Telephone Encounter (Signed)
LVM for patient informing her of upcoming OB visit on 01/19/23, gave her details for appointment, it is also shown in her mychart

## 2023-01-04 NOTE — Patient Instructions (Addendum)
It was great seeing you today!  Because your sugars are still elevated we are starting you on Metformin. Please take one a day for the first 3 days and then twice a day after with meals. You may have some nausea and GI symptoms initially that should go away. Please let us know if it does not.   We are still trying to get a hold of high risk OB, they will call you to schedule that appointment. We have you scheduled here on 6/3 in case you do not get in with them before then. but if you need to be seen earlier than that for any new issues we're happy to fit you in, just give [place] a call!  Feel free to call with any questions or concerns at any time, at [phone].   Take care,  Dr. Cora Collum [facility]

## 2023-01-04 NOTE — Progress Notes (Unsigned)
    SUBJECTIVE:   CHIEF COMPLAINT / HPI:   Patient presents to follow up on her pre gestational diabetes. She was instructed to check her sugars when fasting and 2 hours after meals. Feels well, just a little nauseas sometimes. No emesis. Brought in log of blood sugars:   The high of 166 after meal was after cake for her sons birthday. Otherwise have been watching what she eats, eating smaller portions. Eats rice but cut down portion si  PERTINENT  PMH / PSH: Reviewed   OBJECTIVE:   LMP 10/14/2022    Physical exam General: well appearing, NAD Cardiovascular: RRR, no murmurs Lungs: CTAB. Normal WOB Abdomen: soft, non-distended, non-tender Skin: warm, dry. No edema  ASSESSMENT/PLAN:   No problem-specific Assessment & Plan notes found for this encounter.     Diabetes Detected on early gtt Goals for glucose: fasting <90, 2 hr postprandial <120...if > 50% elevated will start metformin.  We were able to get her scheduled with high risk OB on June 6th   Cora Collum, DO [facility]

## 2023-01-05 NOTE — Assessment & Plan Note (Addendum)
Detected on early gtt. Sugar log indicates majority of readings above goal (Goals for glucose: fasting <90, 2 hr postprandial <120... and if > 50% elevated was instructed to start metformin). Today we started her on Metformin ER 500mg  daily for the first 3 days and then BID. We were able to call and get her scheduled with high risk OB on June 6th. Also placed referral for fetal echo to be done between 18-24 weeks given her diabetes.

## 2023-01-16 ENCOUNTER — Ambulatory Visit: Payer: Self-pay | Admitting: Family Medicine

## 2023-01-19 ENCOUNTER — Ambulatory Visit: Payer: Medicaid Other | Admitting: Registered"

## 2023-01-19 DIAGNOSIS — O24112 Pre-existing diabetes mellitus, type 2, in pregnancy, second trimester: Secondary | ICD-10-CM

## 2023-01-19 NOTE — Progress Notes (Signed)
Patient was seen for Gestational Diabetes self-management on 01/19/23  Start time 0913 and End time 1022    Estimated due date: 07/27/23; [redacted]w[redacted]d  Pt has been referred from [facility]. New ob visit 02/09/23  Clinical: Medications: metformin (not taking, was waiting for today's visit) Medical History: miscarriage Labs: OGTT    Lab Results  Component Value Date   HGBA1C 5.5 07/17/2020    FBS 79-90; PPBG Below 130 (2 hrs after finishing meal), patient is checking 2x/day  Dietary and Lifestyle History: Pt states she eats vegetables daily broccoli, greens, and gave up Sprite, eating less rice and meat. Pt states she also stopped eating large lunch meal, just have small snacks such as fruit or something light like soup.  Pt states she would like to try to control blood sugar with diet and lifestyle before trying medication. Discussed likelyhood that changes we talked about will make enough difference to avoid medication for now, but with hormonal changes in 3rd trimester it may change at that time.  Pt was very concerned she is not feeling the baby and worries due to previous miscarriage. RN, Lyla Son stepped in and assured her that it is normal not to feel movement until 18 weeks. RN took Pt to hear baby's heart beat for reassurance.   Physical Activity: ADL active at work Stress: appears to have high stress due to high risk pregnancy Sleep: not assessed  24 hr Recall:  First Meal: 9:30 1 /12 c rice, vegetables, curry Snack:  Second meal: soup, chicken, 1 1/2 c rice  Snack: apple, banana, 2 slice mango Third meal: (8:30-9 pm) a little more than a cup rice, chicken, spinach,  Snack: Beverages: water, some milk  NUTRITION INTERVENTION  Nutrition education (E-1) on the following topics:   Initial Follow-up  [x]  []  Definition of Gestational Diabetes [x]  []  Why dietary management is important in controlling blood glucose [x]  []  Effects each nutrient has on blood glucose  levels [x]  []  Simple carbohydrates vs complex carbohydrates (brown vs white rice) [x]  []  Fluid intake [x]  []  Creating a balanced meal plan []  []  Carbohydrate counting  [x]  []  When to check blood glucose levels [x]  []  Proper blood glucose monitoring techniques [x]  []  Effect of stress and stress reduction techniques  [x]  []  Exercise effect on blood glucose levels, appropriate exercise during pregnancy []  []  Importance of limiting caffeine and abstaining from alcohol and smoking [x]  []  Medications used for blood sugar control during pregnancy []  []  Hypoglycemia and rule of 15 []  []  Postpartum self care  Patient instructed to monitor glucose levels: FBS: 60 - ? 95 mg/dL; 2 hour: ? 161 mg/dL  Patient received handouts: Nutrition Diabetes and Pregnancy Carbohydrate Counting List  Patient will be seen for follow-up as needed.

## 2023-01-19 NOTE — Patient Instructions (Addendum)
Continue drinking only water and milk. Aim to eat 3 balanced meals per day When eating fruit including a protein with it. Consider going for a 10-15 min walk before some meals If you blood sugar continues to be above normal, start taking Metformin as directed

## 2023-01-19 NOTE — Progress Notes (Addendum)
Patient voiced concerns to RN about not feeling fetal movement yet and she's worried due to hx of SAB x2 last year. Discussed fetal movement is normally not felt until around 18-20 weeks. FHR auscultated to provide reassurance for patient. FHR 156 bpm. Placed order for anatomy ultrasound and encouraged patient to schedule as able  Chase Caller RN BSN 01/19/23

## 2023-02-07 ENCOUNTER — Ambulatory Visit (INDEPENDENT_AMBULATORY_CARE_PROVIDER_SITE_OTHER): Payer: Medicaid Other | Admitting: Registered"

## 2023-02-07 ENCOUNTER — Other Ambulatory Visit: Payer: Self-pay

## 2023-02-07 ENCOUNTER — Encounter: Payer: Medicaid Other | Attending: Family Medicine | Admitting: Registered"

## 2023-02-07 DIAGNOSIS — O24419 Gestational diabetes mellitus in pregnancy, unspecified control: Secondary | ICD-10-CM | POA: Insufficient documentation

## 2023-02-07 DIAGNOSIS — Z3A15 15 weeks gestation of pregnancy: Secondary | ICD-10-CM

## 2023-02-07 DIAGNOSIS — Z3A00 Weeks of gestation of pregnancy not specified: Secondary | ICD-10-CM | POA: Diagnosis not present

## 2023-02-07 IMAGING — US US OB TRANSVAGINAL
1 series · 15 of 28 positions shown · non-contrast
Comparison: Sonogram dated October 25, 2021

CLINICAL DATA: Inconclusive fetal viability

EXAM:
TRANSVAGINAL OB ULTRASOUND
TECHNIQUE: Transvaginal ultrasound was performed for complete evaluation of the
gestation as well as the maternal uterus, adnexal regions, and
pelvic cul-de-sac.

[Series 1: us ob transvaginal · 92 acquisitions, 15 frames shown]
[im 1/92]
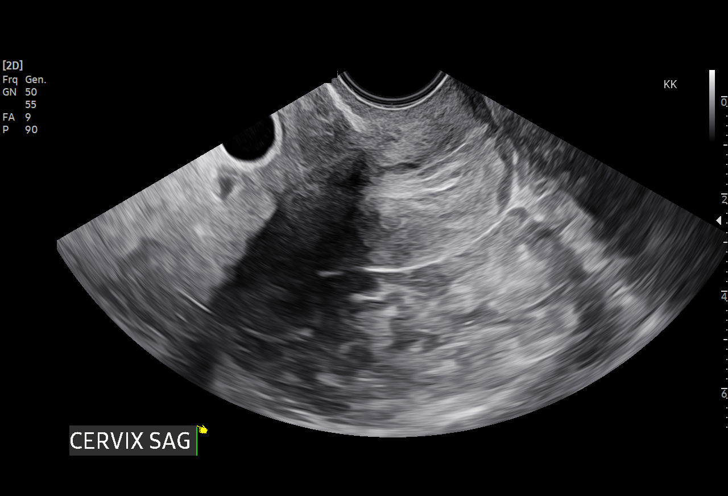
[im 7/92]
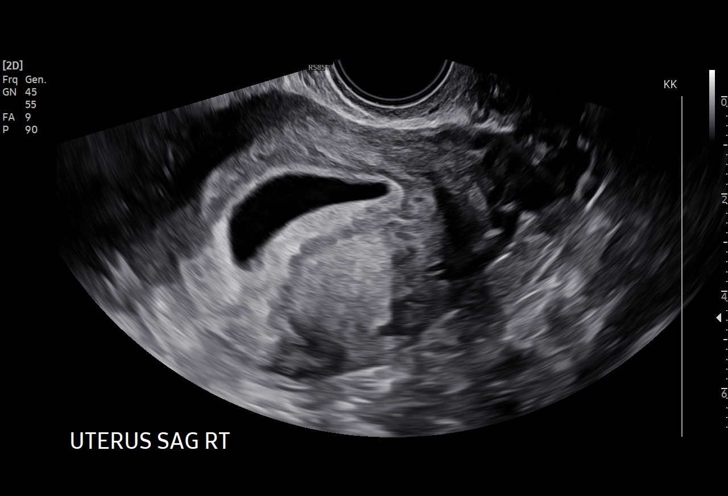
[im 14/92]
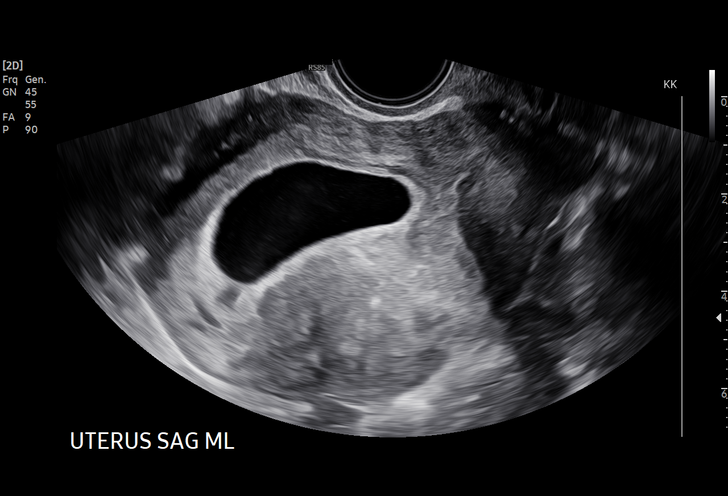
[im 21/92]
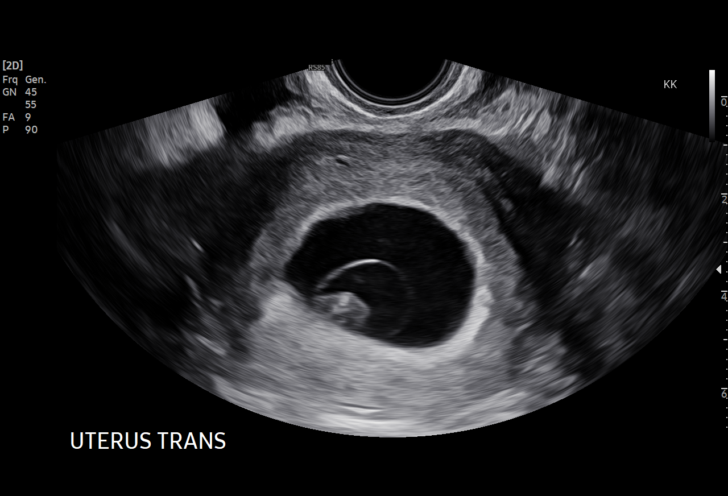
[im 27/92]
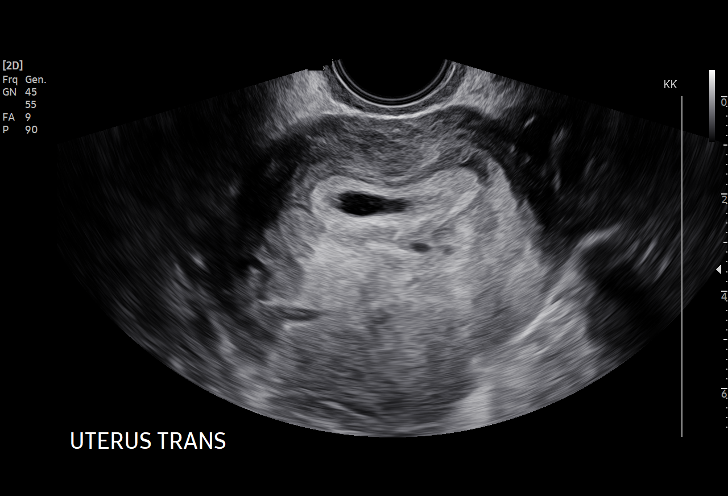
[im 34/92]
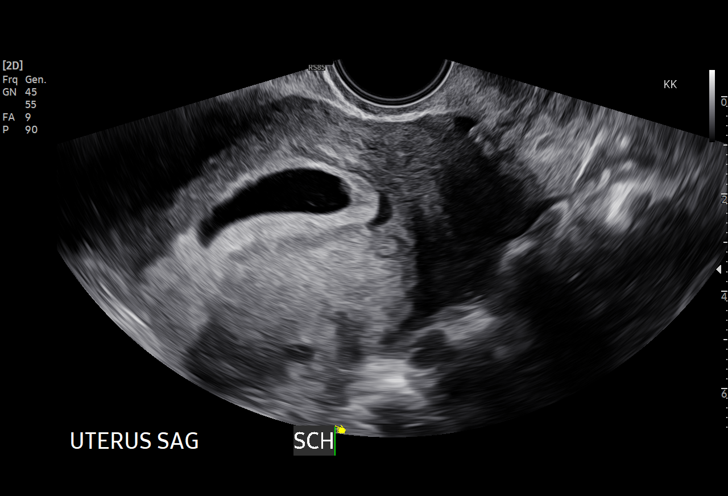
[im 41/92]
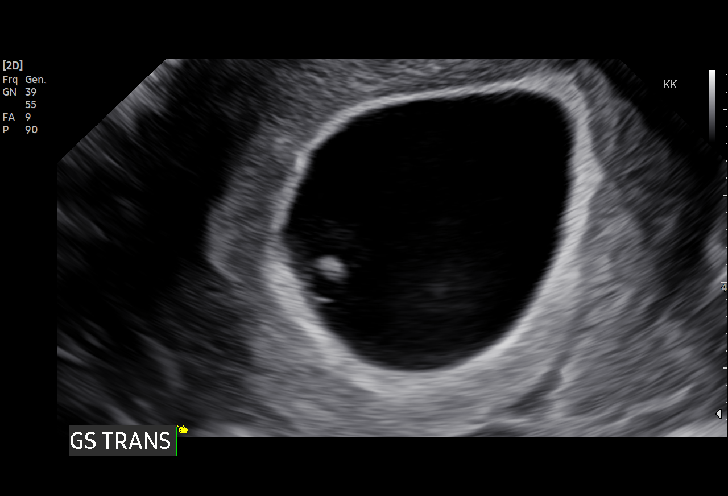
[im 48/92]
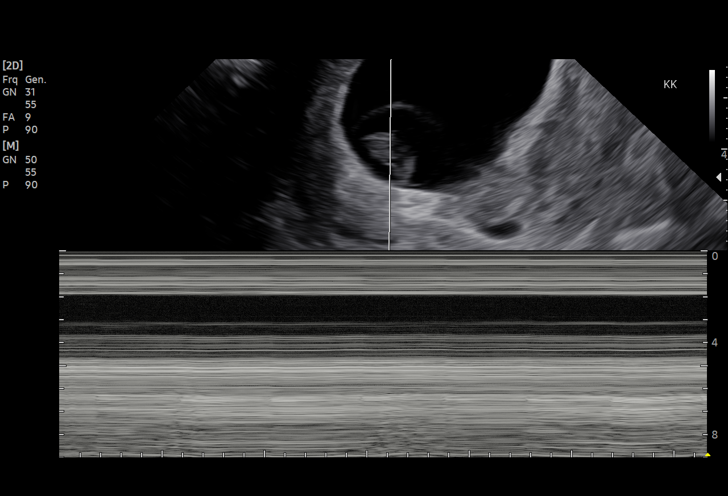
[im 51/92]
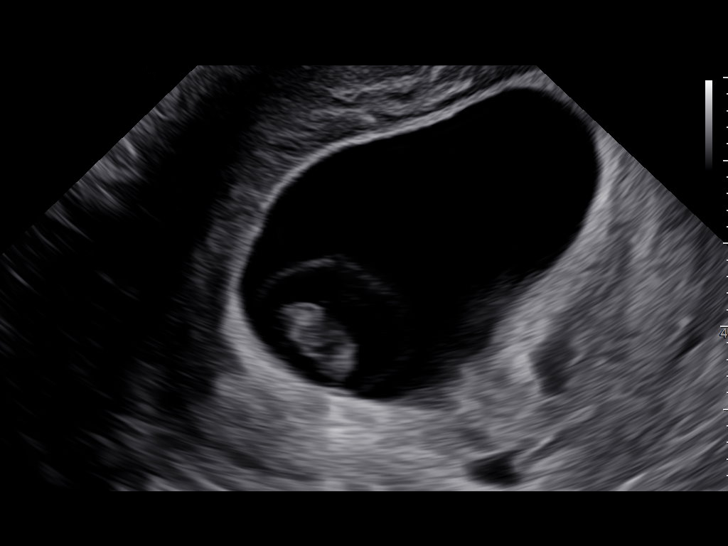
[im 58/92]
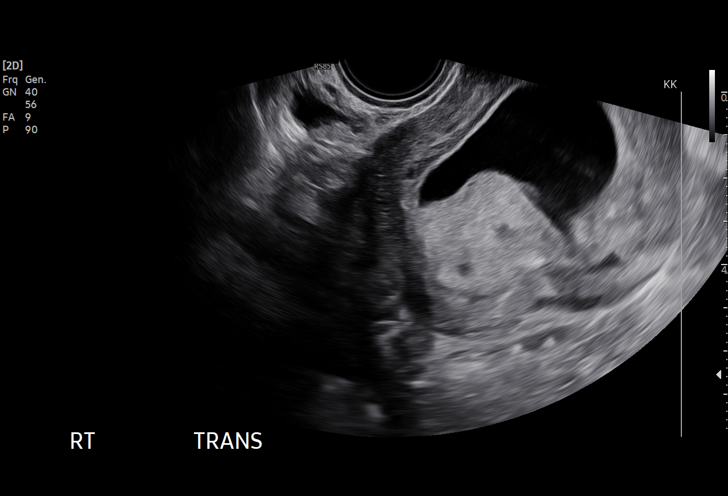
[im 65/92]
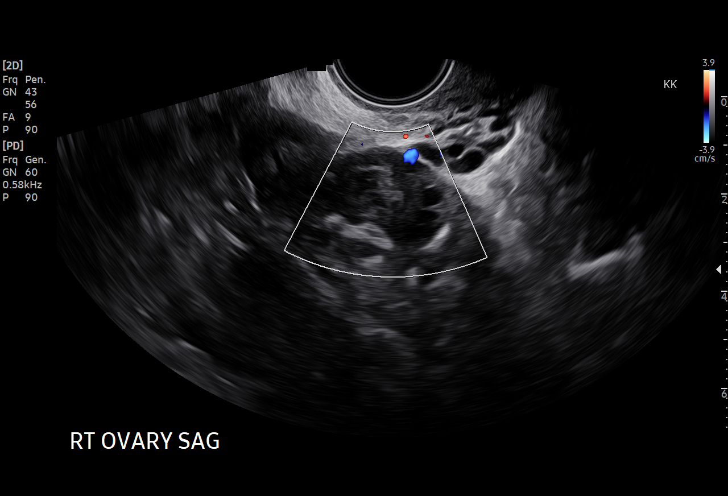
[im 71/92]
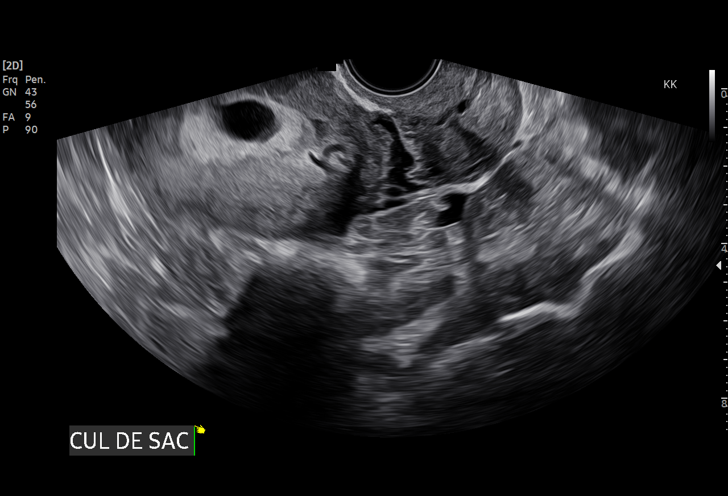
[im 78/92]
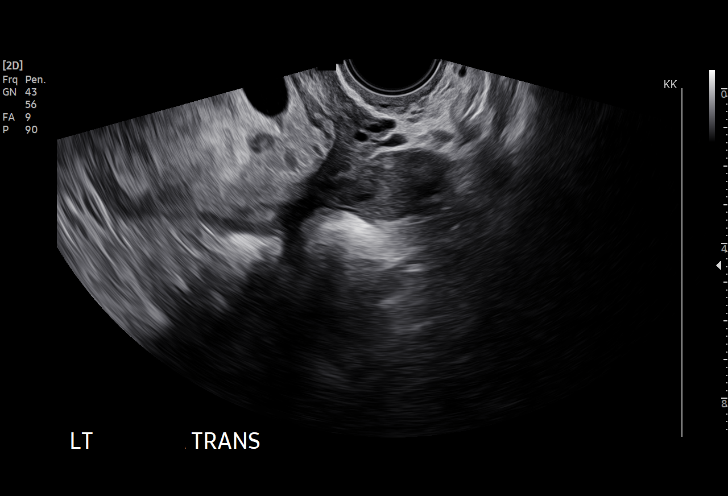
[im 85/92]
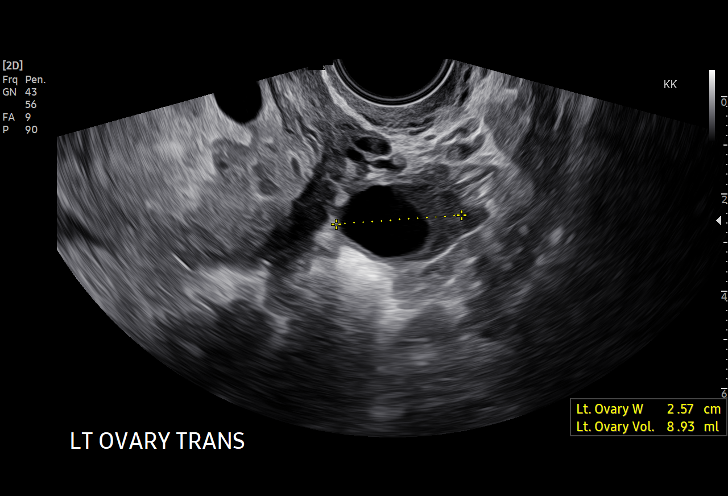
[im 92/92]
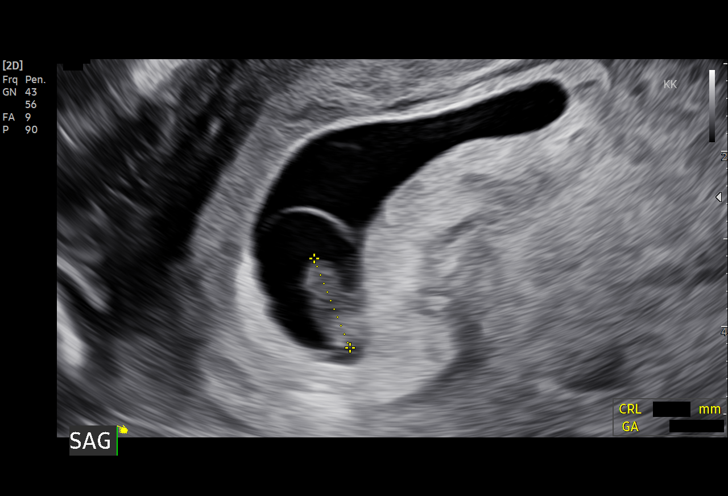

[15 of 28 positions shown; findings below may reference images not displayed]

FINDINGS: Intrauterine gestational sac: Single

Yolk sac:  Visualized.

Embryo:  Visualized.

Cardiac Activity: Not Visualized.

CRL:   11  mm   7 w 1 d

Subchorionic hemorrhage:  Small

Maternal uterus/adnexae: Normal.
IMPRESSION: 1. Crown-rump length of 11 mm corresponding to a gestational age of
7 weeks and 1 day without embryonic heart activity.

Findings meet definitive criteria for failed pregnancy. This follows
SRU consensus guidelines: Diagnostic Criteria for Nonviable
Pregnancy Early in the First Trimester. N Engl J Med

2.  Uterus and adnexa are unremarkable.

## 2023-02-07 NOTE — Progress Notes (Signed)
Patient was seen for follow-up visit for Gestational Diabetes self-management on 02/07/23  Start time 0815 and End time 0845    Estimated due date: 07/27/23; 101w5d  Pt has been referred from [facility]. New ob visit 02/09/23  Clinical: Medications: metformin (patient has picked up rx, but is not taking) Medical History: miscarriage Labs: OGTT    Lab Results  Component Value Date   HGBA1C 5.5 07/17/2020    FBS 79-90; PPBG Below 130 (2 hrs after finishing meal), patient is checking 2x/day  Dietary and Lifestyle History: Pt states she is eating less rice and getting more exercise. A couple of days (6/20) was elevated possibly due to sick for a couple of days. Now glucose numbers are are back in normal levels.   Pt states she has more energy, still has some tiredness.   Physical Activity: walking daily 20-25 min, sometimes after meals  Stress: feel low stress Sleep: 8-9 hours   24 hr Recall:  First Meal: egg, apple Snack:  Second meal: beans, bbq pork, 1 c rice  Snack: milk Third meal: chicken soup and noodles, cucumber, carrots  Snack:  Beverages: 7-8 small bottle water, some milk, 1-2 x/coffee  NUTRITION INTERVENTION  Nutrition education (E-1) on the following topics:   Initial Follow-up  [x]  []  Definition of Gestational Diabetes [x]  []  Why dietary management is important in controlling blood glucose [x]  []  Effects each nutrient has on blood glucose levels [x]  []  Simple carbohydrates vs complex carbohydrates (brown vs white rice) [x]  []  Fluid intake [x]  []  Creating a balanced meal plan []  []  Carbohydrate counting  [x]  []  When to check blood glucose levels [x]  []  Proper blood glucose monitoring techniques [x]  []  Effect of stress and stress reduction techniques  [x]  []  Exercise effect on blood glucose levels, appropriate exercise during pregnancy []  [x]  Importance of limiting caffeine and abstaining from alcohol and smoking [x]  []  Medications used for blood  sugar control during pregnancy []  [x]  Hypoglycemia and rule of 15 []  [x]  Postpartum self care  Patient instructed to monitor glucose levels: FBS: 60 - ? 95 mg/dL; 2 hour: ? 130 mg/dL  Patient received handouts: none  Patient will be seen for follow-up as needed.

## 2023-02-07 NOTE — Progress Notes (Signed)
RN to see patient for FHR due to multiple pregnancy loss, needs reassurance. FHR 140. Explained we do not expect her to feel fetal movement until at least 18-20 weeks. Pt denies any pain or bleeding. Reviewed availability of MAU for any pain or bleeding. Will return next week for OB visit.  Fleet Contras RN

## 2023-02-09 ENCOUNTER — Encounter: Payer: Medicaid Other | Admitting: Obstetrics and Gynecology

## 2023-02-13 ENCOUNTER — Ambulatory Visit (INDEPENDENT_AMBULATORY_CARE_PROVIDER_SITE_OTHER): Payer: Medicaid Other | Admitting: Family Medicine

## 2023-02-13 ENCOUNTER — Other Ambulatory Visit: Payer: Self-pay

## 2023-02-13 ENCOUNTER — Encounter: Payer: Self-pay | Admitting: Family Medicine

## 2023-02-13 VITALS — BP 105/73 | HR 90 | Wt 145.2 lb

## 2023-02-13 DIAGNOSIS — Z3A16 16 weeks gestation of pregnancy: Secondary | ICD-10-CM | POA: Diagnosis not present

## 2023-02-13 DIAGNOSIS — O0992 Supervision of high risk pregnancy, unspecified, second trimester: Secondary | ICD-10-CM

## 2023-02-13 DIAGNOSIS — O09529 Supervision of elderly multigravida, unspecified trimester: Secondary | ICD-10-CM | POA: Insufficient documentation

## 2023-02-13 DIAGNOSIS — O09522 Supervision of elderly multigravida, second trimester: Secondary | ICD-10-CM | POA: Diagnosis not present

## 2023-02-13 DIAGNOSIS — E119 Type 2 diabetes mellitus without complications: Secondary | ICD-10-CM

## 2023-02-13 DIAGNOSIS — Z98891 History of uterine scar from previous surgery: Secondary | ICD-10-CM | POA: Diagnosis not present

## 2023-02-13 DIAGNOSIS — O24415 Gestational diabetes mellitus in pregnancy, controlled by oral hypoglycemic drugs: Secondary | ICD-10-CM | POA: Diagnosis not present

## 2023-02-13 MED ORDER — ASPIRIN 81 MG PO TBEC
81.0000 mg | DELAYED_RELEASE_TABLET | Freq: Every day | ORAL | 3 refills | Status: DC
Start: 2023-02-13 — End: 2023-07-15

## 2023-02-13 MED ORDER — FREESTYLE LIBRE 3 SENSOR MISC
1.0000 | 11 refills | Status: DC
Start: 2023-02-13 — End: 2023-07-15

## 2023-02-13 MED ORDER — BLOOD PRESSURE KIT DEVI
1.0000 | Freq: Once | 0 refills | Status: AC
Start: 2023-02-13 — End: 2023-02-13

## 2023-02-13 NOTE — Patient Instructions (Addendum)
[facility] [address] [place], [place] [address] [phone] Hours: Sunday Closed Monday 9AM-6PM Tuesday 9AM-6PM Wednesday 9AM-6PM Thursday 9AM-6PM Friday           9AM-6PM Saturday         10 AM-1PM  Following an appropriate diet and keeping your blood sugar under control is the most important thing to do for your health and that of your unborn baby.  Please check your blood sugar 4 times daily.  Please keep accurate BS logs and bring them with you to every visit.  Please bring your meter also.  Goals for Blood sugar should be: 1. Fasting (first thing in the morning before eating) should be less than 90.   2.  2 hours after meals should be less than 120.  Please eat 3 meals and 3 snacks.  Include protein (meat, dairy-cheese, eggs, nuts) with all meals.  Be mindful that carbohydrates increase your blood sugar.  Not just sweet food (cookies, cake, donuts, fruit, juice, soda) but also bread, pasta, rice, and potatoes.  You have to limit how many carbs you are eating.  Adding exercise, as little as 30 minutes a day can decrease your blood sugar.

## 2023-02-13 NOTE — Progress Notes (Addendum)
   PRENATAL VISIT NOTE  Subjective:  Erin Avery is a 37 y.o. [id number] at [redacted]w[redacted]d being seen today for transferring prenatal care from [facility].  She is currently monitored for the following issues for this high-risk pregnancy and has Missed abortion; Supervision of high-risk pregnancy; History of cesarean delivery; Diabetes mellitus (HCC); and Gestational diabetes mellitus (GDM), antepartum on their problem list.  Patient reports  feels hungry. Has not started her Metformin has her sugars have been ok.  Contractions: Not present. Vag. Bleeding: None.  Movement: Absent. Denies leaking of fluid.   The following portions of the patient's history were reviewed and updated as appropriate: allergies, current medications, past family history, past medical history, past social history, past surgical history and problem list.   Objective:   Vitals:   02/13/23 0923  BP: 105/73  Pulse: 90  Weight: 145 lb 3.2 oz (65.9 kg)    Fetal Status: Fetal Heart Rate (bpm): 145   Movement: Absent     General:  Alert, oriented and cooperative. Patient is in no acute distress.  Skin: Skin is warm and dry. No rash noted.   Cardiovascular: Normal heart rate noted  Respiratory: Normal respiratory effort, no problems with respiration noted  Abdomen: Soft, gravid, appropriate for gestational age.  Pain/Pressure: Absent     Pelvic: Cervical exam deferred        Extremities: Normal range of motion.  Edema: None  Mental Status: Normal mood and affect. Normal behavior. Normal judgment and thought content.   Assessment and Plan:  Pregnancy: G4P1021 at [redacted]w[redacted]d 1. Supervision of high risk pregnancy in second trimester - Blood Pressure Monitoring (BLOOD PRESSURE KIT) DEVI; 1 each by Does not apply route once for 1 dose.  Dispense: 1 each; Refill: 0 - AFP, Serum, Open Spina Bifida - CHL AMB BABYSCRIPTS SCHEDULE OPTIMIZATION  2. Gestational diabetes mellitus (GDM) controlled on oral hypoglycemic drug, antepartum Baseline  labs CBG review, see log, most in range, about 1/3 of fastings are out of range, add hs protein containing snack, add exercise.  - AFP, Serum, Open Spina Bifida - Ambulatory referral to Ophthalmology - Comp Met (CMET) - Protein / creatinine ratio, urine - HgB A1c - ASA prescribed  3. History of cesarean delivery Desires RCS  4. AMA Declines genetics  General obstetric precautions including but not limited to vaginal bleeding, contractions, leaking of fluid and fetal movement were reviewed in detail with the patient. Please refer to After Visit Summary for other counseling recommendations.   Return in 3 weeks (on 03/06/2023) for [facility].  Future Appointments  Date Time Provider Department Center  03/06/2023  7:30 AM [facility] NURSE [facility]  03/06/2023  7:45 AM [facility] US4 [facility]    Reva Bores, MD

## 2023-02-13 NOTE — Addendum Note (Signed)
Addended by: Marjo Bicker on: 02/13/2023 03:37 PM   Modules accepted: Orders

## 2023-02-13 NOTE — Addendum Note (Signed)
Addended by: Reva Bores on: 02/13/2023 11:25 AM   Modules accepted: Orders

## 2023-02-14 IMAGING — US US OB TRANSVAGINAL
1 series · 15 of 28 positions shown · non-contrast
Comparison: 11/15/2021

CLINICAL DATA: Assess viability, pregnant, missed abortion

EXAM:
TRANSVAGINAL OB ULTRASOUND
TECHNIQUE: Transvaginal ultrasound was performed for complete evaluation of the
gestation as well as the maternal uterus, adnexal regions, and
pelvic cul-de-sac.

[Series 1: us ob transvaginal · 82 acquisitions, 15 frames shown]
[im 1/82]
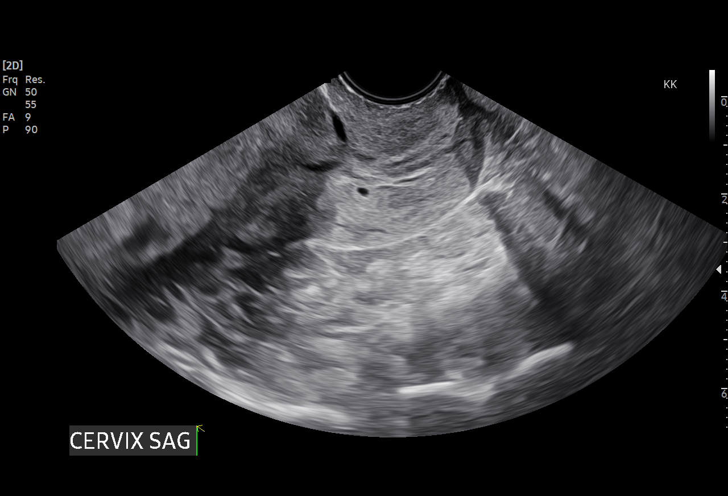
[im 7/82]
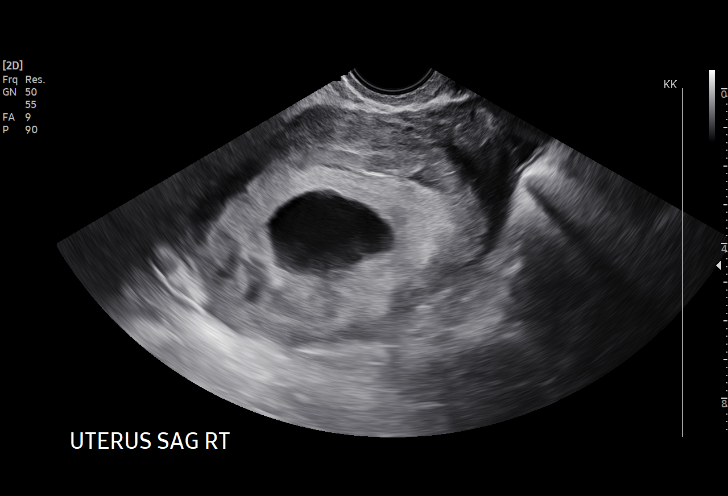
[im 13/82]
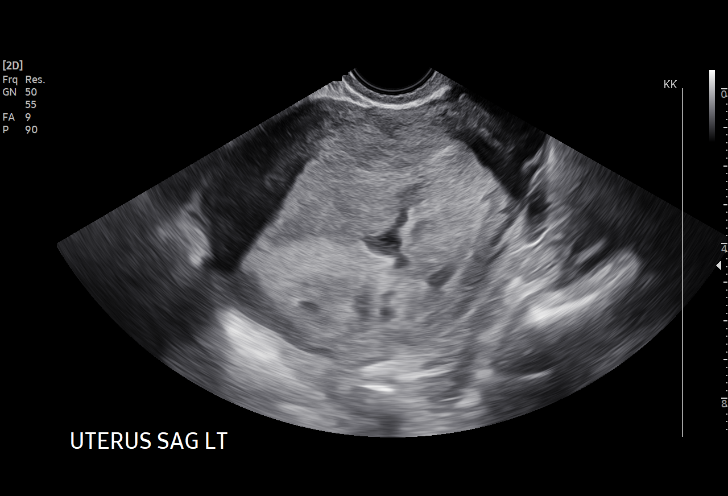
[im 19/82]
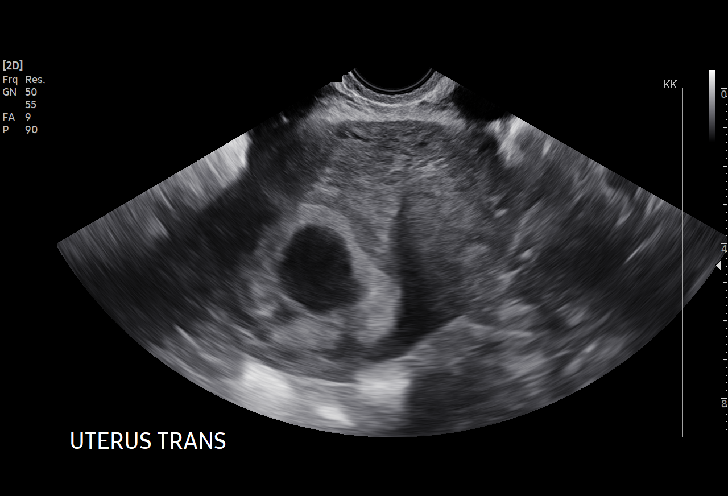
[im 25/82]
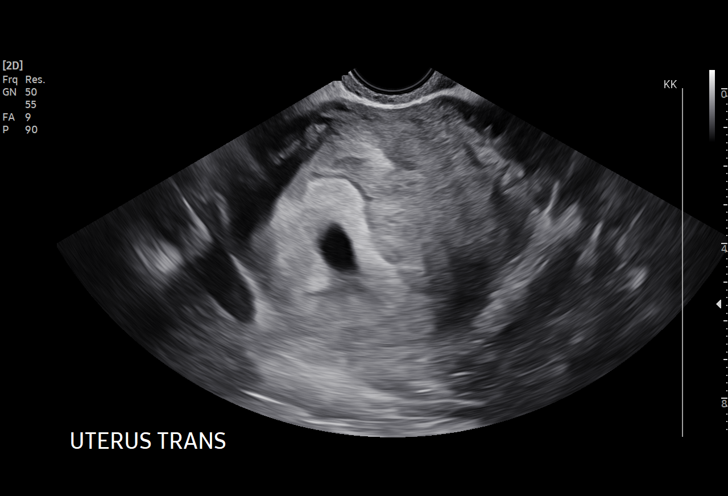
[im 31/82]
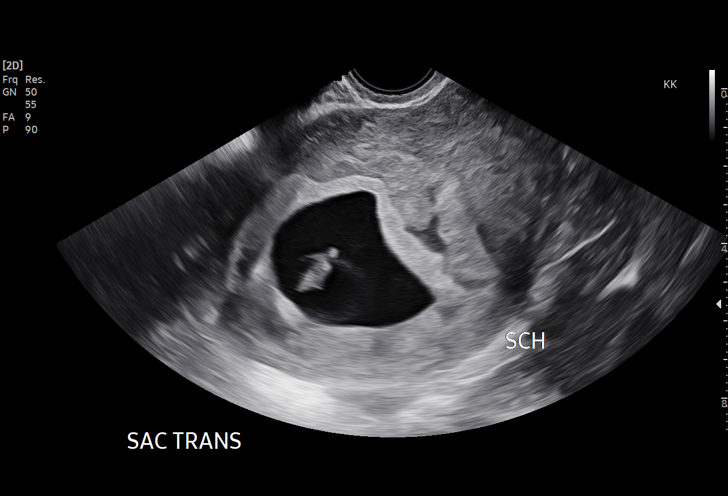
[im 37/82]
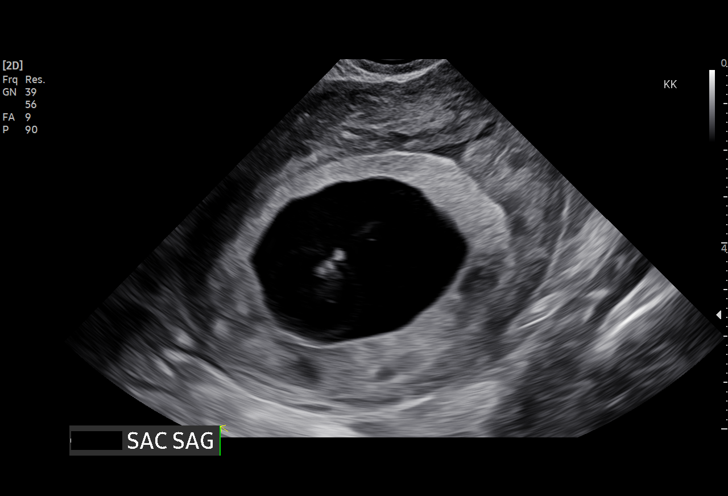
[im 43/82]
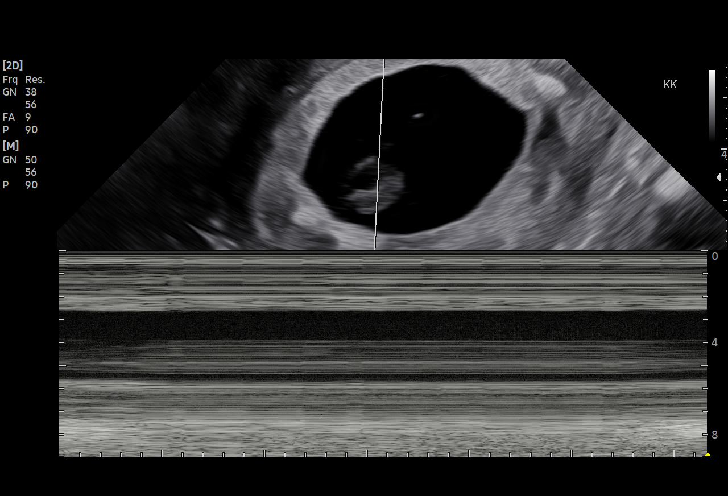
[im 46/82]
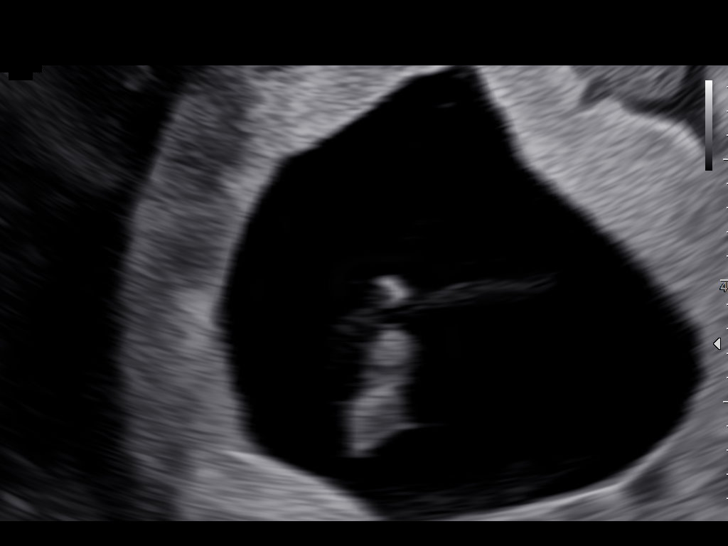
[im 52/82]
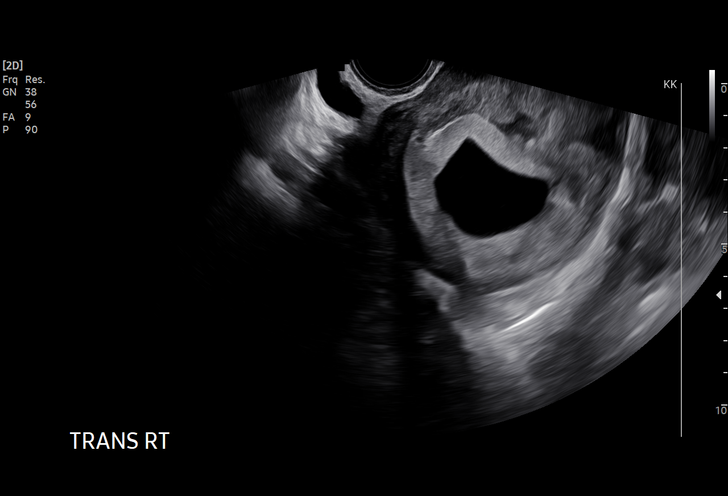
[im 58/82]
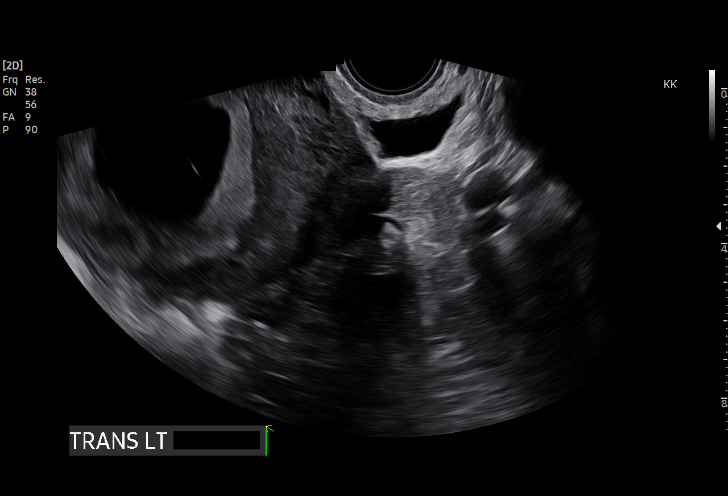
[im 64/82]
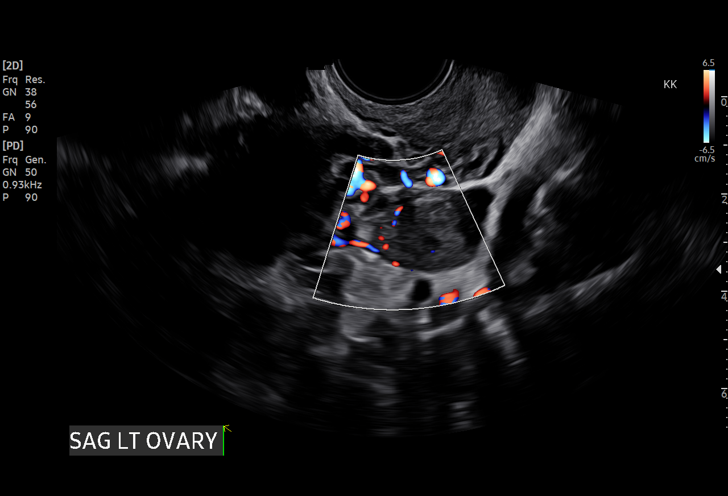
[im 70/82]
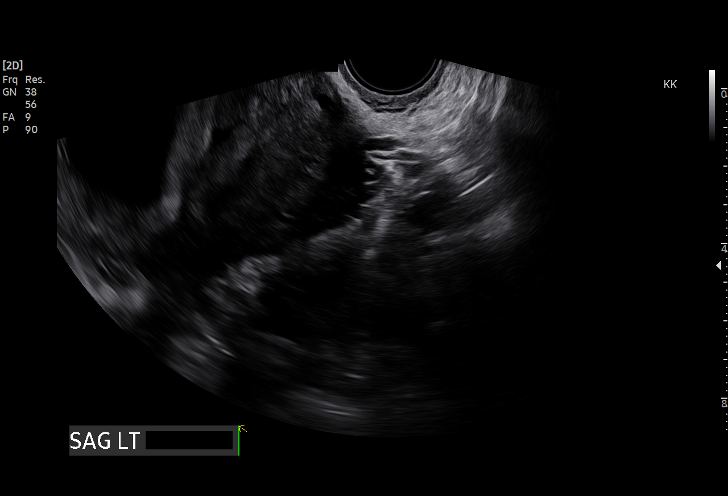
[im 76/82]
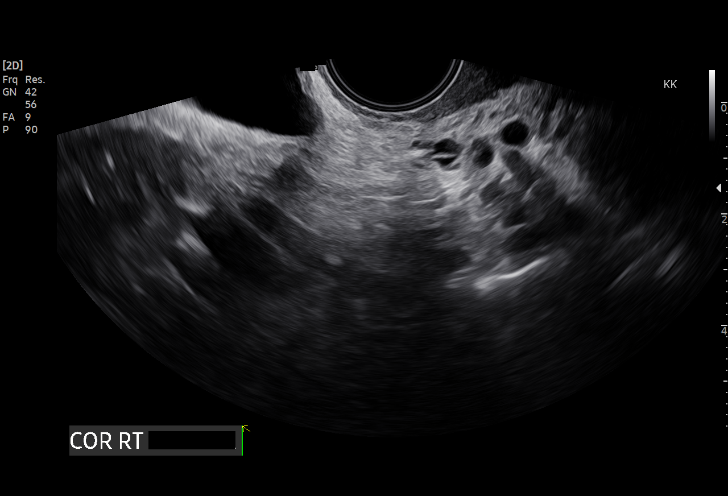
[im 82/82]
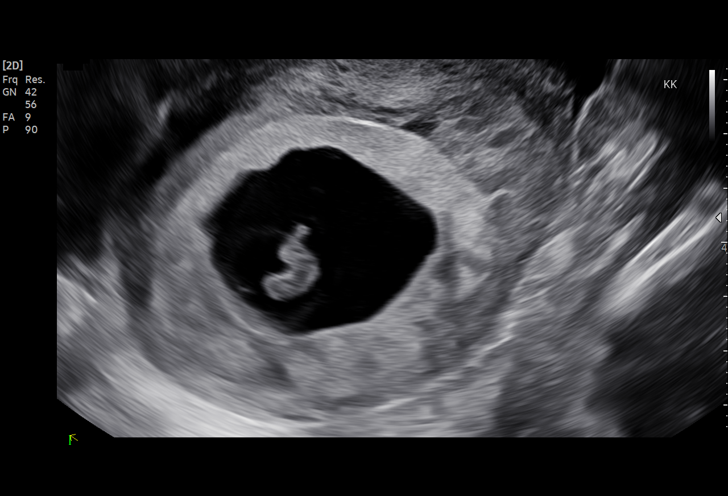

[15 of 28 positions shown; findings below may reference images not displayed]

FINDINGS: Intrauterine gestational sac: Present, single

Yolk sac:  Not identified

Embryo:  Present

Cardiac Activity: Not identified

Heart Rate: N/A bpm

CRL:   11.7 mm   7 w 3 d                  US EDC: 07/08/2022

Subchorionic hemorrhage:  Small subchorionic hemorrhage x2

Maternal uterus/adnexae:

Remainder of uterus unremarkable.

Ovaries normal appearance.

No free pelvic fluid or adnexal masses.
IMPRESSION: Intrauterine gestational sac containing a fetal pole.

No fetal cardiac activity identified.

Small subchronic hemorrhage x2.

Findings meet definitive criteria for failed pregnancy. This follows
SRU consensus guidelines: Diagnostic Criteria for Nonviable
Pregnancy Early in the First Trimester. N Engl J Med

## 2023-02-15 LAB — COMPREHENSIVE METABOLIC PANEL
ALT: 8 IU/L (ref 0–32)
AST: 10 IU/L (ref 0–40)
Albumin: 3.9 g/dL (ref 3.9–4.9)
Alkaline Phosphatase: 49 IU/L (ref 44–121)
BUN/Creatinine Ratio: 13 (ref 9–23)
BUN: 7 mg/dL (ref 6–20)
Bilirubin Total: <0.2 mg/dL (ref 0.0–1.2)
CO2: 20 mmol/L (ref 20–29)
Calcium: 9.1 mg/dL (ref 8.7–10.2)
Chloride: 106 mmol/L (ref 96–106)
Creatinine, Ser: 0.53 mg/dL — ABNORMAL LOW (ref 0.57–1.00)
Globulin, Total: 2.6 g/dL (ref 1.5–4.5)
Glucose: 79 mg/dL (ref 70–99)
Potassium: 4.2 mmol/L (ref 3.5–5.2)
Sodium: 139 mmol/L (ref 134–144)
Total Protein: 6.5 g/dL (ref 6.0–8.5)
eGFR: 124 mL/min/{1.73_m2} (ref >59)

## 2023-02-15 LAB — HEMOGLOBIN A1C
Est. average glucose Bld gHb Est-mCnc: 105 mg/dL
Hgb A1c MFr Bld: 5.3 % (ref 4.8–5.6)

## 2023-02-15 LAB — AFP, SERUM, OPEN SPINA BIFIDA
AFP MoM: 0.84
AFP Value: 24.5 ng/mL
Gest. Age on Collection Date: 16.4 weeks
Maternal Age At EDD: 35.5 yr
OSBR Risk 1 IN: 6672
Test Results:: NEGATIVE
Weight: 145 [lb_av]

## 2023-02-15 LAB — PROTEIN / CREATININE RATIO, URINE
Creatinine, Urine: 40 mg/dL
Protein, Ur: 6.7 mg/dL
Protein/Creat Ratio: 168 mg/g creat (ref 0–200)

## 2023-02-27 DIAGNOSIS — O99210 Obesity complicating pregnancy, unspecified trimester: Secondary | ICD-10-CM | POA: Insufficient documentation

## 2023-03-06 ENCOUNTER — Encounter: Payer: Self-pay | Admitting: *Deleted

## 2023-03-06 ENCOUNTER — Ambulatory Visit: Payer: Medicaid Other | Attending: Obstetrics and Gynecology

## 2023-03-06 ENCOUNTER — Ambulatory Visit: Payer: Medicaid Other | Admitting: *Deleted

## 2023-03-06 ENCOUNTER — Encounter: Payer: Self-pay | Admitting: Obstetrics and Gynecology

## 2023-03-06 ENCOUNTER — Other Ambulatory Visit: Payer: Self-pay | Admitting: *Deleted

## 2023-03-06 ENCOUNTER — Ambulatory Visit: Payer: Medicaid Other | Attending: Obstetrics | Admitting: Obstetrics

## 2023-03-06 ENCOUNTER — Ambulatory Visit: Payer: Medicaid Other | Admitting: Obstetrics and Gynecology

## 2023-03-06 VITALS — BP 108/69 | HR 95

## 2023-03-06 VITALS — BP 108/69 | HR 95 | Wt 145.8 lb

## 2023-03-06 DIAGNOSIS — O3503X0 Maternal care for (suspected) central nervous system malformation or damage in fetus, choroid plexus cysts, not applicable or unspecified: Secondary | ICD-10-CM

## 2023-03-06 DIAGNOSIS — Z98891 History of uterine scar from previous surgery: Secondary | ICD-10-CM

## 2023-03-06 DIAGNOSIS — O09522 Supervision of elderly multigravida, second trimester: Secondary | ICD-10-CM

## 2023-03-06 DIAGNOSIS — Z3A19 19 weeks gestation of pregnancy: Secondary | ICD-10-CM | POA: Diagnosis not present

## 2023-03-06 DIAGNOSIS — O0992 Supervision of high risk pregnancy, unspecified, second trimester: Secondary | ICD-10-CM | POA: Diagnosis not present

## 2023-03-06 DIAGNOSIS — E669 Obesity, unspecified: Secondary | ICD-10-CM

## 2023-03-06 DIAGNOSIS — O34219 Maternal care for unspecified type scar from previous cesarean delivery: Secondary | ICD-10-CM

## 2023-03-06 DIAGNOSIS — O24112 Pre-existing diabetes mellitus, type 2, in pregnancy, second trimester: Secondary | ICD-10-CM | POA: Insufficient documentation

## 2023-03-06 DIAGNOSIS — O99212 Obesity complicating pregnancy, second trimester: Secondary | ICD-10-CM

## 2023-03-06 DIAGNOSIS — E1169 Type 2 diabetes mellitus with other specified complication: Secondary | ICD-10-CM | POA: Diagnosis not present

## 2023-03-06 DIAGNOSIS — O24410 Gestational diabetes mellitus in pregnancy, diet controlled: Secondary | ICD-10-CM | POA: Diagnosis not present

## 2023-03-06 DIAGNOSIS — O24415 Gestational diabetes mellitus in pregnancy, controlled by oral hypoglycemic drugs: Secondary | ICD-10-CM

## 2023-03-06 NOTE — Progress Notes (Signed)
MFM Note  Erin Avery was seen for a detailed fetal anatomy scan due to advanced maternal age.  She was recently diagnosed with pregestational versus early onset diet-controlled gestational diabetes.  She has not had a screening test for fetal aneuploidy drawn in her current pregnancy.  She was informed that the fetal growth and amniotic fluid level were appropriate for her gestational age.   On today's exam, bilateral choroid plexus cysts were noted in the fetal brain.  The implications and management of choroid plexus cysts were discussed today.  She was advised that the choroid plexus cysts are most likely normal variants and will usually resolve at around 24 weeks.  The small association of bilateral choroid plexus cysts with trisomy 18 was discussed.  She was advised that as no other anomalies were noted on today's ultrasound exam, it is highly unlikely that her fetus has trisomy 32.    Due to the small association between choroid plexus cysts and trisomy 74 and advanced maternal age, she was offered and declined an amniocentesis today for definitive diagnosis of fetal aneuploidy.  She was also advised that she may still have a screening test such as the cell free DNA test drawn for detection of fetal aneuploidy.  She will consider having a cell free DNA test drawn soon.  The patient was informed that anomalies may be missed due to technical limitations. If the fetus is in a suboptimal position or maternal habitus is increased, visualization of the fetus in the maternal uterus may be impaired.  The following were discussed during today's consultation:  Early onset gestational diabetes versus pregestational diabetes  The implications and management of diabetes in pregnancy was discussed in detail with the patient.   She was advised that our goals for her fingerstick values are fasting values of 90-95 or less and two-hour postprandials of 120 or less.  Should her fingerstick values be  above these values, she may have to be started on insulin or metformin to help her achieve better glycemic control. The patient was advised that getting her fingerstick values as close to these goals as possible would provide her with the most optimal obstetrical outcome.  Due to early onset versus pregestational diabetes, she was referred for a fetal echocardiogram with [facility].    We will continue to follow her with growth ultrasounds throughout her pregnancy.  Weekly fetal testing starting at 32 weeks is recommended should she require insulin or metformin for treatment of diabetes.    A follow-up exam was scheduled in 5 weeks.    The patient and her husband stated that all of their questions were answered today.  A total of 45 minutes was spent counseling and coordinating the care for this patient.  Greater than 50% of the time was spent in direct face-to-face contact.

## 2023-03-06 NOTE — Progress Notes (Signed)
Subjective:  Erin Avery is a 37 y.o. [id number] at [redacted]w[redacted]d being seen today for ongoing prenatal care.  She is currently monitored for the following issues for this high-risk pregnancy and has Supervision of high-risk pregnancy; History of cesarean delivery; Diabetes mellitus (HCC); Gestational diabetes mellitus (GDM), antepartum; AMA (advanced maternal age) multigravida 35+; and Obesity affecting pregnancy on their problem list.  Patient reports no complaints.  Contractions: Not present. Vag. Bleeding: None.  Movement: Absent. Denies leaking of fluid.   The following portions of the patient's history were reviewed and updated as appropriate: allergies, current medications, past family history, past medical history, past social history, past surgical history and problem list. Problem list updated.  Objective:   Vitals:   03/06/23 0911  BP: 108/69  Pulse: 95  Weight: 145 lb 12.8 oz (66.1 kg)    Fetal Status: Fetal Heart Rate (bpm): 145   Movement: Absent     General:  Alert, oriented and cooperative. Patient is in no acute distress.  Skin: Skin is warm and dry. No rash noted.   Cardiovascular: Normal heart rate noted  Respiratory: Normal respiratory effort, no problems with respiration noted  Abdomen: Soft, gravid, appropriate for gestational age. Pain/Pressure: Absent     Pelvic:  Cervical exam deferred        Extremities: Normal range of motion.  Edema: Trace  Mental Status: Normal mood and affect. Normal behavior. Normal judgment and thought content.   Urinalysis:      Assessment and Plan:  Pregnancy: G4P1021 at [redacted]w[redacted]d  1. Supervision of high risk pregnancy in second trimester Stable Anatomy scan today  2. Gestational diabetes mellitus (GDM) controlled on oral hypoglycemic drug, antepartum CBG's in goal range No taking Metformin Has not been able to use Free Style. Has been checking CBG's via Glucometer. CBG goals reviewed with pt Will refer to DM education to assist with  Free Style - Referral to Nutrition and Diabetes Services  3. Multigravida of advanced maternal age in second trimester Declined genetic testing  4. History of cesarean delivery Desires repeat  Preterm labor symptoms and general obstetric precautions including but not limited to vaginal bleeding, contractions, leaking of fluid and fetal movement were reviewed in detail with the patient. Please refer to After Visit Summary for other counseling recommendations.  Return in about 4 weeks (around 04/03/2023) for OB visit, face to face, MD only.   Hermina Staggers, MD

## 2023-03-06 NOTE — Progress Notes (Signed)
Pt reports that her Freestyle Libre 3 Sensor is not sticking, so not using. But is doing finger sticks but did not bring numbers.

## 2023-03-08 ENCOUNTER — Encounter: Payer: Self-pay | Admitting: Obstetrics and Gynecology

## 2023-03-08 DIAGNOSIS — O3503X0 Maternal care for (suspected) central nervous system malformation or damage in fetus, choroid plexus cysts, not applicable or unspecified: Secondary | ICD-10-CM | POA: Insufficient documentation

## 2023-03-21 ENCOUNTER — Other Ambulatory Visit: Payer: Medicaid Other

## 2023-03-27 DIAGNOSIS — O24415 Gestational diabetes mellitus in pregnancy, controlled by oral hypoglycemic drugs: Secondary | ICD-10-CM | POA: Diagnosis not present

## 2023-03-28 ENCOUNTER — Telehealth: Payer: Self-pay | Admitting: Family Medicine

## 2023-03-28 ENCOUNTER — Ambulatory Visit (INDEPENDENT_AMBULATORY_CARE_PROVIDER_SITE_OTHER): Payer: Medicaid Other

## 2023-03-28 ENCOUNTER — Encounter: Payer: Medicaid Other | Attending: Family Medicine | Admitting: Dietician

## 2023-03-28 ENCOUNTER — Ambulatory Visit (INDEPENDENT_AMBULATORY_CARE_PROVIDER_SITE_OTHER): Payer: Medicaid Other | Admitting: Dietician

## 2023-03-28 ENCOUNTER — Other Ambulatory Visit: Payer: Self-pay

## 2023-03-28 VITALS — BP 102/69 | HR 81 | Wt 147.4 lb

## 2023-03-28 DIAGNOSIS — O24415 Gestational diabetes mellitus in pregnancy, controlled by oral hypoglycemic drugs: Secondary | ICD-10-CM

## 2023-03-28 DIAGNOSIS — O24419 Gestational diabetes mellitus in pregnancy, unspecified control: Secondary | ICD-10-CM | POA: Insufficient documentation

## 2023-03-28 DIAGNOSIS — Z3A22 22 weeks gestation of pregnancy: Secondary | ICD-10-CM | POA: Diagnosis not present

## 2023-03-28 DIAGNOSIS — Z3A00 Weeks of gestation of pregnancy not specified: Secondary | ICD-10-CM | POA: Insufficient documentation

## 2023-03-28 DIAGNOSIS — O0992 Supervision of high risk pregnancy, unspecified, second trimester: Secondary | ICD-10-CM

## 2023-03-28 DIAGNOSIS — R21 Rash and other nonspecific skin eruption: Secondary | ICD-10-CM | POA: Diagnosis not present

## 2023-03-28 DIAGNOSIS — O09522 Supervision of elderly multigravida, second trimester: Secondary | ICD-10-CM

## 2023-03-28 MED ORDER — HYDROXYZINE PAMOATE 25 MG PO CAPS
25.0000 mg | ORAL_CAPSULE | Freq: Four times a day (QID) | ORAL | 0 refills | Status: DC | PRN
Start: 2023-03-28 — End: 2023-04-10

## 2023-03-28 MED ORDER — HYDROCORTISONE 1 % EX OINT
1.0000 | TOPICAL_OINTMENT | Freq: Two times a day (BID) | CUTANEOUS | 0 refills | Status: DC
Start: 2023-03-28 — End: 2023-07-05

## 2023-03-28 NOTE — Progress Notes (Signed)
Patient reports itchiness x1 week in lower groin area. Patient has rash that start in inner thighs and migrates to outer buttocks area. Rash is red and raised bumps. Patient has tried coconut oil and Johnson baby powder to help--not resolving issue per patient. Rash is starting to spread to arms.   No change in detergents, soaps, or lotions. No new foods tried recently. Patient reports that she has not been outside; she does not have any pets. No one else at home has rash.   Started Metformin 2 weeks ago; checking CBGs--last one was 147 after lunch yesterday on 03/27/23.  Consulted with Dr. Alysia Penna; provider also came in room and inspected patient in person. Per provider, order vistaril 25mg  q6 Prn and hydrocortisone 1% cream. Labs also ordered per provider: CBC, CMP, total biles.   Maureen Ralphs RN on 03/28/23 at 1030

## 2023-03-28 NOTE — Telephone Encounter (Signed)
Open in error

## 2023-03-28 NOTE — Progress Notes (Signed)
Patient was seen for Gestational Diabetes self-management on 03/28/2023  Start time 0815  and End time 0900   Estimated due date: 07/27/2023; [redacted]w[redacted]d  Patient is here today alone.  She would like assistance with the Free Style Libre 3.  She put on her first sensor but it fell off a couple of hours later.  Discussed that she should call the company and they may replace the sensor.  Discussed skin tac and overpatch options.  Replace and started a new sensor on her left upper arm.  Clinical: Medications: per patient Metformin 500 mg per day (prescribed for 1000 mg and recommended that she increase to this dose) Medical History: miscarriage Labs: OGTT     A1c 5.3% 02/13/2023  Dietary and Lifestyle History: Patient lives with her husband, mother-in-law, child. She works at a [employer] and is on her feet all day.  Physical Activity: walking daily Stress: normal Sleep: 8-9 hours  24 hr Recall:  First Meal: SKIPS or 1 egg, apple Snack:   Second meal:  rice, curry, vegetables, meat Snack:  apple or banana, 1 cup 1% milk Third meal:  rice, curry, vegetables, meat Snack:  occasional cereal and milk in the middle of the night Beverages:  water, Herbal Tea with 1% milk and 1/2 tsp sugar   NUTRITION INTERVENTION  Nutrition education (E-1) on the following topics:   Initial Follow-up  [x]  []  Definition of Gestational Diabetes [x]  []  Why dietary management is important in controlling blood glucose [x]  []  Effects each nutrient has on blood glucose levels [x]  []  Simple carbohydrates vs complex carbohydrates [x]  []  Fluid intake [x]  []  Creating a balanced meal plan []  [x]  Carbohydrate counting  [x]  []  When to check blood glucose levels [x]  []  Proper blood glucose monitoring techniques [x]  []  Effect of stress and stress reduction techniques  [x]  []  Exercise effect on blood glucose levels, appropriate exercise during pregnancy []  []  Importance of limiting caffeine and abstaining from alcohol and  smoking [x]  [x]  Medications used for blood sugar control during pregnancy []  [x]  Hypoglycemia and rule of 15 []  [x]  Postpartum self care   Patient is testing pre breakfast and 2 hours after each meal. FBS: 105 Postprandial: 142  Patient instructed to monitor glucose levels: FBS: 60 - ? 95 mg/dL; 2 hour: ? 161 mg/dL  Patient received handouts: Nutrition Diabetes and Pregnancy Carbohydrate Counting List  Patient will be seen for follow-up as needed.

## 2023-04-10 ENCOUNTER — Other Ambulatory Visit: Payer: Self-pay | Admitting: *Deleted

## 2023-04-10 ENCOUNTER — Ambulatory Visit (INDEPENDENT_AMBULATORY_CARE_PROVIDER_SITE_OTHER): Payer: Medicaid Other | Admitting: Obstetrics and Gynecology

## 2023-04-10 ENCOUNTER — Other Ambulatory Visit: Payer: Self-pay

## 2023-04-10 ENCOUNTER — Ambulatory Visit: Payer: Medicaid Other | Admitting: *Deleted

## 2023-04-10 ENCOUNTER — Encounter: Payer: Self-pay | Admitting: Obstetrics and Gynecology

## 2023-04-10 ENCOUNTER — Ambulatory Visit: Payer: Medicaid Other | Attending: Obstetrics

## 2023-04-10 VITALS — BP 101/62 | HR 75

## 2023-04-10 VITALS — BP 101/62 | HR 75 | Wt 147.0 lb

## 2023-04-10 DIAGNOSIS — Z7984 Long term (current) use of oral hypoglycemic drugs: Secondary | ICD-10-CM

## 2023-04-10 DIAGNOSIS — E1169 Type 2 diabetes mellitus with other specified complication: Secondary | ICD-10-CM | POA: Diagnosis not present

## 2023-04-10 DIAGNOSIS — O09522 Supervision of elderly multigravida, second trimester: Secondary | ICD-10-CM

## 2023-04-10 DIAGNOSIS — Z3A24 24 weeks gestation of pregnancy: Secondary | ICD-10-CM | POA: Diagnosis not present

## 2023-04-10 DIAGNOSIS — O24112 Pre-existing diabetes mellitus, type 2, in pregnancy, second trimester: Secondary | ICD-10-CM | POA: Diagnosis not present

## 2023-04-10 DIAGNOSIS — O34219 Maternal care for unspecified type scar from previous cesarean delivery: Secondary | ICD-10-CM | POA: Diagnosis not present

## 2023-04-10 DIAGNOSIS — O24912 Unspecified diabetes mellitus in pregnancy, second trimester: Secondary | ICD-10-CM

## 2023-04-10 DIAGNOSIS — O3503X0 Maternal care for (suspected) central nervous system malformation or damage in fetus, choroid plexus cysts, not applicable or unspecified: Secondary | ICD-10-CM

## 2023-04-10 DIAGNOSIS — O0992 Supervision of high risk pregnancy, unspecified, second trimester: Secondary | ICD-10-CM

## 2023-04-10 DIAGNOSIS — E669 Obesity, unspecified: Secondary | ICD-10-CM

## 2023-04-10 DIAGNOSIS — Z98891 History of uterine scar from previous surgery: Secondary | ICD-10-CM

## 2023-04-10 DIAGNOSIS — E119 Type 2 diabetes mellitus without complications: Secondary | ICD-10-CM

## 2023-04-10 DIAGNOSIS — R21 Rash and other nonspecific skin eruption: Secondary | ICD-10-CM

## 2023-04-10 DIAGNOSIS — O99212 Obesity complicating pregnancy, second trimester: Secondary | ICD-10-CM | POA: Insufficient documentation

## 2023-04-10 MED ORDER — HYDROCORTISONE 1 % EX LOTN
1.0000 | TOPICAL_LOTION | Freq: Two times a day (BID) | CUTANEOUS | 0 refills | Status: DC
Start: 2023-04-10 — End: 2023-04-28

## 2023-04-10 MED ORDER — HYDROXYZINE PAMOATE 25 MG PO CAPS
25.0000 mg | ORAL_CAPSULE | Freq: Four times a day (QID) | ORAL | 0 refills | Status: DC | PRN
Start: 2023-04-10 — End: 2023-07-15

## 2023-04-10 NOTE — Progress Notes (Signed)
  Subjective:  Erin Avery is a 37 y.o. [id number] at [redacted]w[redacted]d being seen today for ongoing prenatal care.  She is currently monitored for the following issues for this high-risk pregnancy and has Supervision of high-risk pregnancy; History of cesarean delivery; Diabetes mellitus (HCC); Gestational diabetes mellitus (GDM), antepartum; AMA (advanced maternal age) multigravida 35+; Obesity affecting pregnancy; Choroid plexus cyst of fetus affecting care of mother, antepartum; and Rash on their problem list.  Patient reports  rash .  Contractions: Not present. Vag. Bleeding: None.  Movement: Present. Denies leaking of fluid.   The following portions of the patient's history were reviewed and updated as appropriate: allergies, current medications, past family history, past medical history, past social history, past surgical history and problem list. Problem list updated.  Objective:   Vitals:   04/10/23 1105  BP: 101/62  Pulse: 75  Weight: 147 lb (66.7 kg)    Fetal Status: Fetal Heart Rate (bpm): 136 Fundal Height: 25 cm Movement: Present     General:  Alert, oriented and cooperative. Patient is in no acute distress.  Skin: Skin is warm and dry. No rash noted.   Cardiovascular: Normal heart rate noted  Respiratory: Normal respiratory effort, no problems with respiration noted  Abdomen: Soft, gravid, appropriate for gestational age. Pain/Pressure: Absent     Pelvic:  Cervical exam deferred        Extremities: Normal range of motion.  Edema: None  Mental Status: Normal mood and affect. Normal behavior. Normal judgment and thought content.   Urinalysis:      Assessment and Plan:  Pregnancy: G4P1021 at [redacted]w[redacted]d  1. Supervision of high risk pregnancy in second trimester Stable  2. History of cesarean delivery Desires repeat c section  3. Type 2 diabetes mellitus without complication, without long-term current use of insulin (HCC) Stable Did not bring CBG readings but reports in goal  range Fetal ECHO normal Serial growth scans and antenatal testing weekly, starting at 32 weeks  4. Multigravida of advanced maternal age in second trimester Declined genetic testing  5. Choroid plexus cyst of fetus affecting care of mother, antepartum, single or unspecified fetus Resolving on 04/10/23 U/S Declined genetic testing  6. Rash W/U negative thus far Had with previous pregnancy, improved with Interferon infusion ( per pt's husband) ? Bx was normal at that time as well - Ambulatory referral to Dermatology  Preterm labor symptoms and general obstetric precautions including but not limited to vaginal bleeding, contractions, leaking of fluid and fetal movement were reviewed in detail with the patient. Please refer to After Visit Summary for other counseling recommendations.  Return in about 4 weeks (around 05/08/2023) for OB visit, face to face, MD only.   Hermina Staggers, MD

## 2023-04-20 ENCOUNTER — Ambulatory Visit
Admission: EM | Admit: 2023-04-20 | Discharge: 2023-04-20 | Disposition: A | Discharge status: Home / Self Care | Payer: Medicaid Other | Attending: Internal Medicine | Admitting: Internal Medicine

## 2023-04-20 DIAGNOSIS — Z3A26 26 weeks gestation of pregnancy: Secondary | ICD-10-CM

## 2023-04-20 DIAGNOSIS — R21 Rash and other nonspecific skin eruption: Secondary | ICD-10-CM | POA: Diagnosis not present

## 2023-04-20 NOTE — Discharge Instructions (Addendum)
You have declined different options for steroid treatments as these treatments have previously failed.  I recommend you continue using with your OB/GYN prescribed and follow-up urgently with the allergy clinic for consideration of immunomodulating injections as deemed appropriate.  If you would like to go to a different clinic, you would have to request this external referral from the OB/GYN.

## 2023-04-20 NOTE — ED Triage Notes (Signed)
Pt reports rash all over the body x 2 months. Reports she has a rash with other pregnancy. Pt reports she had hydroxyzine and hydrocortisone cream prescribed by OB/GYN 2 weeks ago, gave no relief. Reports she was referred to Dermatologist and the appt is in Oct.    Pt is [redacted] weeks pregnant.

## 2023-04-20 NOTE — ED Provider Notes (Signed)
[name]  Note:  This document was prepared using Conservation officer, historic buildings and may include unintentional dictation errors.  MRN: [id number] DOB: Dec 16, 1985  Subjective:   Erin Avery is a 37 y.o. female [redacted] weeks pregnant presenting for 9-month history of persistent and worsening pruritic rash.  Patient has been managed by her OB/GYN.  They have used topical steroid ointment, steroid lotion.  Has been using oral antihistamines as well.  Per the patient's husband, they were told to come to our clinic as the rash is not improving.  Chart review shows that a referral was placed to dermatology.  Patient's husband reports that she has previously had this rash in pregnancy and can be severe in terms of the itching.  She has undergone multiple steroid treatments and antihistamines before none of which worked.  Has previously responded to "interferon gamma injections" which was done to an allergy clinic in [place].  Would like a referral to a different practice started what her OB/GYN provided.  No tenderness, drainage of pus or bleeding.  No fevers.  No current facility-administered medications for this encounter.  Current Outpatient Medications:    Accu-Chek Softclix Lancets lancets, Use as instructed, Disp: 100 each, Rfl: 12   aspirin EC 81 MG tablet, Take 1 tablet (81 mg total) by mouth daily., Disp: 90 tablet, Rfl: 3   Blood Glucose Monitoring Suppl (ACCU-CHEK GUIDE) w/Device KIT, 1 each by Does not apply route 3 (three) times daily., Disp: 1 kit, Rfl: 0   Continuous Glucose Sensor (FREESTYLE LIBRE 3 SENSOR) MISC, 1 each by Does not apply route every 14 (fourteen) days. Place 1 sensor on the skin every 14 days. Use to check glucose continuously, Disp: 2 each, Rfl: 11   glucose blood (ACCU-CHEK SMARTVIEW) test strip, Use as instructed, Disp: 100 each, Rfl: 12   hydrocortisone 1 % lotion, Apply 1 Application topically 2 (two) times daily., Disp: 118 mL, Rfl:  0   hydrocortisone 1 % ointment, Apply 1 Application topically 2 (two) times daily., Disp: 30 g, Rfl: 0   hydrOXYzine (VISTARIL) 25 MG capsule, Take 1 capsule (25 mg total) by mouth every 6 (six) hours as needed for itching., Disp: 30 capsule, Rfl: 0   metFORMIN (GLUCOPHAGE-XR) 500 MG 24 hr tablet, Take 1 tablet by mouth 2 (two) times daily with a meal., Disp: , Rfl:    Prenatal Vit-Fe Fumarate-FA (MULTIVITAMIN-PRENATAL) 27-0.8 MG TABS tablet, Take 1 tablet by mouth daily at 12 noon., Disp: , Rfl:    No Known Allergies  Past Medical History:  Diagnosis Date   GERD (gastroesophageal reflux disease)    Missed abortion 11/15/2021     Past Surgical History:  Procedure Laterality Date   CESAREAN SECTION     DILATION AND EVACUATION N/A 11/24/2021   Procedure: DILATATION AND EVACUATION;  Surgeon: Warden Fillers, MD;  Location: [facility] OR;  Service: Gynecology;  Laterality: N/A;    Family History  Problem Relation Age of Onset   Diabetes Mother    Hypertension Mother    Asthma Father     Social History   Tobacco Use   Smoking status: Never   Smokeless tobacco: Never  Vaping Use   Vaping status: Never Used  Substance Use Topics   Alcohol use: Never   Drug use: Never    ROS   Objective:   Vitals: BP 105/70 (BP Location: Right Arm)   Pulse 87   Temp 98.1 F (36.7 C) (Oral)   Resp  18   LMP 10/14/2022   SpO2 98%   Breastfeeding No   Physical Exam Constitutional:      General: She is not in acute distress.    Appearance: Normal appearance. She is well-developed. She is not ill-appearing, toxic-appearing or diaphoretic.  HENT:     Head: Normocephalic and atraumatic.     Nose: Nose normal.     Mouth/Throat:     Mouth: Mucous membranes are moist.  Eyes:     General: No scleral icterus.       Right eye: No discharge.        Left eye: No discharge.     Extraocular Movements: Extraocular movements intact.  Cardiovascular:     Rate and Rhythm: Normal rate.  Pulmonary:      Effort: Pulmonary effort is normal.  Skin:    General: Skin is warm and dry.     Findings: Rash (diffusely scattered excoriations over the extremities, worst over the lower legs; the excoriations coalescing to a larger plaque over the left lateral and posterior thigh; no tenderness, drainage of pus or bleeding) present.  Neurological:     General: No focal deficit present.     Mental Status: She is alert and oriented to person, place, and time.  Psychiatric:        Mood and Affect: Mood normal.        Behavior: Behavior normal.     Assessment and Plan :   PDMP not reviewed this encounter.  1. Rash and nonspecific skin eruption   2. [redacted] weeks gestation of pregnancy     Patient and her husband declined any different kind of steroid treatments as she has previously not responded to these.  I advised that they follow-up with the OB/GYN and provided them with a referral to the allergy clinic as requested.  Maintain treatments as prescribed by their OB/GYN for now.  Counseled patient on potential for adverse effects with medications prescribed/recommended today, ER and return-to-clinic precautions discussed, patient verbalized understanding.    Wallis Bamberg, [place] 04/20/23 1116

## 2023-04-28 ENCOUNTER — Other Ambulatory Visit: Payer: Self-pay | Admitting: Obstetrics and Gynecology

## 2023-04-28 ENCOUNTER — Ambulatory Visit
Admission: EM | Admit: 2023-04-28 | Discharge: 2023-04-28 | Disposition: A | Discharge status: Home / Self Care | Payer: Medicaid Other

## 2023-04-28 ENCOUNTER — Encounter (HOSPITAL_COMMUNITY): Payer: Self-pay | Admitting: Obstetrics & Gynecology

## 2023-04-28 ENCOUNTER — Other Ambulatory Visit: Payer: Self-pay

## 2023-04-28 ENCOUNTER — Inpatient Hospital Stay (HOSPITAL_COMMUNITY)
Admission: AD | Admit: 2023-04-28 | Discharge: 2023-04-28 | Disposition: A | Discharge status: Home / Self Care | Payer: Medicaid Other | Attending: Obstetrics & Gynecology | Admitting: Obstetrics & Gynecology

## 2023-04-28 ENCOUNTER — Encounter: Payer: Self-pay | Admitting: *Deleted

## 2023-04-28 DIAGNOSIS — R1032 Left lower quadrant pain: Secondary | ICD-10-CM | POA: Diagnosis not present

## 2023-04-28 DIAGNOSIS — O26892 Other specified pregnancy related conditions, second trimester: Secondary | ICD-10-CM | POA: Insufficient documentation

## 2023-04-28 DIAGNOSIS — O09522 Supervision of elderly multigravida, second trimester: Secondary | ICD-10-CM | POA: Insufficient documentation

## 2023-04-28 DIAGNOSIS — R21 Rash and other nonspecific skin eruption: Secondary | ICD-10-CM

## 2023-04-28 DIAGNOSIS — O99712 Diseases of the skin and subcutaneous tissue complicating pregnancy, second trimester: Secondary | ICD-10-CM

## 2023-04-28 DIAGNOSIS — Z3689 Encounter for other specified antenatal screening: Secondary | ICD-10-CM

## 2023-04-28 DIAGNOSIS — Z3A27 27 weeks gestation of pregnancy: Secondary | ICD-10-CM | POA: Diagnosis not present

## 2023-04-28 DIAGNOSIS — L299 Pruritus, unspecified: Secondary | ICD-10-CM | POA: Diagnosis not present

## 2023-04-28 DIAGNOSIS — L282 Other prurigo: Secondary | ICD-10-CM

## 2023-04-28 DIAGNOSIS — O0992 Supervision of high risk pregnancy, unspecified, second trimester: Secondary | ICD-10-CM

## 2023-04-28 LAB — URINALYSIS, ROUTINE W REFLEX MICROSCOPIC
Bilirubin Urine: NEGATIVE
Glucose, UA: NEGATIVE mg/dL
Hgb urine dipstick: NEGATIVE
Ketones, ur: NEGATIVE mg/dL
Leukocytes,Ua: NEGATIVE
Nitrite: NEGATIVE
Protein, ur: NEGATIVE mg/dL
Specific Gravity, Urine: 1.01 (ref 1.005–1.030)
pH: 6 (ref 5.0–8.0)

## 2023-04-28 MED ORDER — HYDROCORTISONE 2.5 % EX LOTN: TOPICAL_LOTION | Freq: Two times a day (BID) | CUTANEOUS | 0 refills | Status: DC

## 2023-04-28 NOTE — MAU Note (Addendum)
Pt went to Urgent Care tonight 730pm. 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 8/10 Pain- now 5/10 - she thinks it's gas.  No pain with voiding . Also has a rash -

## 2023-04-28 NOTE — Telephone Encounter (Signed)
Patient's husband calls nurse line regarding request for hydrocortisone cream. He has been trying to reach gynecology office regarding refill, however, has not been able to reach anyone.   Requesting refill through our office as they are completely out. Advised this this is also OTC and they could pick up at any pharmacy.   Husband is also asking if we could see patient in our office due to rash. She is being followed by Dr.Ervin for pregnancy. Please advise if office visit for rash would be appropriate or should she scheduled with OB provider. Patient also has a dermatology appointment scheduled for 05/18/23.  Veronda Prude, RN

## 2023-04-28 NOTE — ED Triage Notes (Addendum)
Itching rash x 2 months- using hydrocortisone cream but ran out. Also reports abdominal pain since 530 today- requesting to "check the baby". Reports she has gest diabetes also

## 2023-04-28 NOTE — MAU Provider Note (Signed)
History     CSN: [id number]  Arrival date and time: 04/28/23 2117   Event Date/Time   First Provider Initiated Contact with Patient 04/28/23 2149      Chief Complaint  Patient presents with  . Abdominal Pain   Erin Avery is a 37 y.o. [id number] at [redacted]w[redacted]d who receives care at [facility].  She presents today for abdominal pain and skin rash.  Patient states today at   OB History     Gravida  4   Para  1   Term  1   Preterm      AB  2   Living  1      SAB  2   IAB  0   Ectopic      Multiple      Live Births  1           Past Medical History:  Diagnosis Date  . GERD (gastroesophageal reflux disease)   . Missed abortion 11/15/2021    Past Surgical History:  Procedure Laterality Date  . CESAREAN SECTION    . DILATION AND EVACUATION N/A 11/24/2021   Procedure: DILATATION AND EVACUATION;  Surgeon: Warden Fillers, MD;  Location: [facility] OR;  Service: Gynecology;  Laterality: N/A;    Family History  Problem Relation Age of Onset  . Diabetes Mother   . Hypertension Mother   . Asthma Father     Social History   Tobacco Use  . Smoking status: Never  . Smokeless tobacco: Never  Vaping Use  . Vaping status: Never Used  Substance Use Topics  . Alcohol use: Never  . Drug use: Never    Allergies: No Known Allergies  Medications Prior to Admission  Medication Sig Dispense Refill Last Dose  . aspirin EC 81 MG tablet Take 1 tablet (81 mg total) by mouth daily. 90 tablet 3 04/27/2023  . hydrocortisone 1 % ointment Apply 1 Application topically 2 (two) times daily. 30 g 0 04/27/2023  . hydrOXYzine (VISTARIL) 25 MG capsule Take 1 capsule (25 mg total) by mouth every 6 (six) hours as needed for itching. 30 capsule 0 04/27/2023  . metFORMIN (GLUCOPHAGE-XR) 500 MG 24 hr tablet Take 1 tablet by mouth 2 (two) times daily with a meal.   04/28/2023  . Prenatal Vit-Fe Fumarate-FA (MULTIVITAMIN-PRENATAL) 27-0.8 MG TABS tablet Take 1 tablet by mouth daily at 12 noon.    04/28/2023  . Accu-Chek Softclix Lancets lancets Use as instructed 100 each 12   . Blood Glucose Monitoring Suppl (ACCU-CHEK GUIDE) w/Device KIT 1 each by Does not apply route 3 (three) times daily. 1 kit 0   . Continuous Glucose Sensor (FREESTYLE LIBRE 3 SENSOR) MISC 1 each by Does not apply route every 14 (fourteen) days. Place 1 sensor on the skin every 14 days. Use to check glucose continuously 2 each 11   . glucose blood (ACCU-CHEK SMARTVIEW) test strip Use as instructed 100 each 12   . hydrocortisone 1 % lotion Apply 1 Application topically 2 (two) times daily. 118 mL 0     Review of Systems Physical Exam   Blood pressure (!) 106/59, pulse 95, temperature 97.9 F (36.6 C), temperature source Oral, resp. rate 16, height 4\' 8"  (1.422 m), weight 68.2 kg, last menstrual period 10/14/2022.  Physical Exam Vitals reviewed. Exam conducted with a chaperone present Stanton Kidney, RN).  Constitutional:      Appearance: She is well-developed.  HENT:     Head: Normocephalic.  Cardiovascular:  Rate and Rhythm: Normal rate.  Pulmonary:     Effort: Pulmonary effort is normal. No respiratory distress.  Abdominal:     Palpations: Abdomen is soft.     Tenderness: There is abdominal tenderness.       Comments: Abd Gravid, Appears AGA. Soft resting tone.  Tenderness as identified above.   Skin:    Findings: Rash present. Rash is papular.     Comments: Small red, crusty, papular rash noted on arms, thighs, and lower back.  Excoriations noted.    Neurological:     Mental Status: She is alert.    Fetal Assessment 140 bpm, Mod Var, -Decels, +Accels Toco: Irritability  MAU Course  No results found for this or any previous visit (from the past 24 hour(s)). No results found.  MDM PE Labs: None EFM Prescription Assessment and Plan  37 year old G4P1021  SIUP at 27.1 weeks Cat I FT Abdominal Pain Itching    -Exam findings discussed. Cherre Robins MSN, CNM 04/28/2023, 9:50 PM     Reassessment (11:10 PM) -NST reactive for GA -Provider to bedside and patient reports resolution of abdominal pain.  -Reassured that monitoring is as expected and no contractions graphed.  -Patient husband now at bedside and expresses extensive concern regarding patient itching. -Discussed scheduled dermatology and allergy appt in 2 weeks. -Patient offered oral antihistamine and declines stating she has vistaril, but does not take it regularly.  -Patient requests refill of topical lotion and new script sent to pharmacy. Rx for Hydrocortisone 2.5% lotion sent to pharmacy on file.  -Patient husband questions if provider can start patient on steroid taper and informed that dermatologist and allergist will decide on best methods for treating skin condition. -Patient husband expresses concern with patient stating she said she "rather jump out of a window" in previous pregnancy when itching got bad. -Provider offers support and understanding regarding frustrations accompanied by itching.  Further instructed SO to take patient to nearest ER/PEC for evaluation if making SI threats. -Reassured that patient can return for worsening condition.   -Strongly encouraged to keep all appts as scheduled. -Patient and husband without further questions. -Encouraged to call primary office or return to MAU if symptoms worsen or with the onset of new symptoms. -Discharged to home in stable condition.  Cherre Robins MSN, CNM Advanced Practice Provider, [facility]

## 2023-05-08 ENCOUNTER — Ambulatory Visit: Payer: Medicaid Other | Attending: Maternal & Fetal Medicine

## 2023-05-08 ENCOUNTER — Other Ambulatory Visit: Payer: Self-pay | Admitting: *Deleted

## 2023-05-08 VITALS — BP 106/60 | HR 94

## 2023-05-08 DIAGNOSIS — Z7984 Long term (current) use of oral hypoglycemic drugs: Secondary | ICD-10-CM | POA: Diagnosis not present

## 2023-05-08 DIAGNOSIS — E669 Obesity, unspecified: Secondary | ICD-10-CM

## 2023-05-08 DIAGNOSIS — O3503X0 Maternal care for (suspected) central nervous system malformation or damage in fetus, choroid plexus cysts, not applicable or unspecified: Secondary | ICD-10-CM | POA: Diagnosis not present

## 2023-05-08 DIAGNOSIS — O09523 Supervision of elderly multigravida, third trimester: Secondary | ICD-10-CM

## 2023-05-08 DIAGNOSIS — Z3A28 28 weeks gestation of pregnancy: Secondary | ICD-10-CM | POA: Diagnosis not present

## 2023-05-08 DIAGNOSIS — E1169 Type 2 diabetes mellitus with other specified complication: Secondary | ICD-10-CM

## 2023-05-08 DIAGNOSIS — O09522 Supervision of elderly multigravida, second trimester: Secondary | ICD-10-CM | POA: Diagnosis not present

## 2023-05-08 DIAGNOSIS — O99213 Obesity complicating pregnancy, third trimester: Secondary | ICD-10-CM

## 2023-05-08 DIAGNOSIS — O24113 Pre-existing diabetes mellitus, type 2, in pregnancy, third trimester: Secondary | ICD-10-CM | POA: Diagnosis not present

## 2023-05-08 DIAGNOSIS — O34219 Maternal care for unspecified type scar from previous cesarean delivery: Secondary | ICD-10-CM

## 2023-05-08 DIAGNOSIS — O0990 Supervision of high risk pregnancy, unspecified, unspecified trimester: Secondary | ICD-10-CM | POA: Insufficient documentation

## 2023-05-08 DIAGNOSIS — O24912 Unspecified diabetes mellitus in pregnancy, second trimester: Secondary | ICD-10-CM | POA: Diagnosis not present

## 2023-05-10 ENCOUNTER — Ambulatory Visit (INDEPENDENT_AMBULATORY_CARE_PROVIDER_SITE_OTHER): Payer: Medicaid Other | Admitting: Obstetrics & Gynecology

## 2023-05-10 ENCOUNTER — Encounter: Payer: Self-pay | Admitting: Obstetrics & Gynecology

## 2023-05-10 ENCOUNTER — Other Ambulatory Visit: Payer: Self-pay

## 2023-05-10 VITALS — BP 93/65 | HR 96 | Wt 148.0 lb

## 2023-05-10 DIAGNOSIS — Z3A28 28 weeks gestation of pregnancy: Secondary | ICD-10-CM | POA: Diagnosis not present

## 2023-05-10 DIAGNOSIS — Z98891 History of uterine scar from previous surgery: Secondary | ICD-10-CM

## 2023-05-10 DIAGNOSIS — O24913 Unspecified diabetes mellitus in pregnancy, third trimester: Secondary | ICD-10-CM | POA: Diagnosis not present

## 2023-05-10 DIAGNOSIS — O0993 Supervision of high risk pregnancy, unspecified, third trimester: Secondary | ICD-10-CM

## 2023-05-10 DIAGNOSIS — O09523 Supervision of elderly multigravida, third trimester: Secondary | ICD-10-CM

## 2023-05-10 NOTE — Progress Notes (Signed)
Previously seen        Upper Extremities:      Previously seen  RVOT:                  Previously seen        Lower Extremities:      Previously seen  Other:  VC, 3VV, 3VTV, heels/feet, Nasal bone, lenses, maxilla, mandible,          falx, and hands previously visualized. Female gender previously          seen. Technically difficult due to fetal position. ---------------------------------------------------------------------- Comments  This patient was seen for a follow up growth scan due to  pregestational diabetes treated with metformin and maternal  obesity with a BMI of 34.  She denies any problems since her  last exam.  She had a normal fetal echocardiogram performed with [facility].  She was informed that the fetal growth and amniotic fluid  level appears appropriate for her gestational age.  The previously noted choroid plexus cysts have resolved.  Due to pregestational diabetes, we will start weekly fetal  testing at 32 weeks.  A follow-up growth scan and BPP was scheduled in 4 weeks. ----------------------------------------------------------------------                   Ma Rings, MD Electronically Signed Final Report   05/08/2023 09:37 am  ----------------------------------------------------------------------   [place] [facility] FOLLOW UP  Result Date: 04/10/2023 ----------------------------------------------------------------------  OBSTETRICS REPORT                       (Signed Final 04/10/2023 10:36 am) ---------------------------------------------------------------------- Patient Info  ID #:       [id number]                          D.O.B.:  09-11-1985 (37 yrs)  Name:       Erin Avery                Visit Date: 04/10/2023 09:08 am ---------------------------------------------------------------------- Performed By  Attending:        Braxton Feathers DO       Ref. Address:     [address]                                                             [place], [place]                                                             [address]  Performed By:     Eden Lathe BS      [facility]                    RDMS RVT                                 Fetal Care at  PRENATAL VISIT NOTE  Subjective:  Erin Avery is a 37 y.o. U2V2536 at [redacted]w[redacted]d being seen today for ongoing prenatal care.  She is currently monitored for the following issues for this high-risk pregnancy and has Supervision of high-risk pregnancy; History of cesarean delivery; Diabetes in pregnancy; AMA (advanced maternal age) multigravida 35+; Obesity affecting pregnancy; Choroid plexus cyst of fetus affecting care of mother, antepartum; and Rash on their problem list.  Patient reports no complaints.  Contractions: Not present. Vag. Bleeding: None.  Movement: Present. Denies leaking of fluid.   The following portions of the patient's history were reviewed and updated as appropriate: allergies, current medications, past family history, past medical history, past social history, past surgical history and problem list.   Objective:   Vitals:   05/10/23 0908  BP: 93/65  Pulse: 96  Weight: 148 lb (67.1 kg)    Fetal Status: Fetal Heart Rate (bpm): 143   Movement: Present     General:  Alert, oriented and cooperative. Patient is in no acute distress.  Skin: Skin is warm and dry. No rash noted.   Cardiovascular: Normal heart rate noted  Respiratory: Normal respiratory effort, no problems with respiration noted  Abdomen: Soft, gravid, appropriate for gestational age.  Pain/Pressure: Present     Pelvic: Cervical exam deferred        Extremities: Normal range of motion.  Edema: None  Mental Status: Normal mood and affect. Normal behavior. Normal judgment and thought content.   [place] [facility] FOLLOW UP  Result Date: 05/08/2023 ----------------------------------------------------------------------  OBSTETRICS REPORT                       (Signed Final 05/08/2023 09:37 am) ---------------------------------------------------------------------- Patient Info  ID #:       [id number]                          D.O.B.:  1986/02/16 (37 yrs)  Name:       Erin Avery                Visit Date: 05/08/2023  07:19 am ---------------------------------------------------------------------- Performed By  Attending:        Ma Rings MD         Ref. Address:     [address]                                                             [place], [place]                                                             [address]  Performed By:     Isac Sarna        [facility]                    BS RDMS  Previously seen        Upper Extremities:      Previously seen  RVOT:                  Previously seen        Lower Extremities:      Previously seen  Other:  VC, 3VV, 3VTV, heels/feet, Nasal bone, lenses, maxilla, mandible,          falx, and hands previously visualized. Female gender previously          seen. Technically difficult due to fetal position. ---------------------------------------------------------------------- Comments  This patient was seen for a follow up growth scan due to  pregestational diabetes treated with metformin and maternal  obesity with a BMI of 34.  She denies any problems since her  last exam.  She had a normal fetal echocardiogram performed with [facility].  She was informed that the fetal growth and amniotic fluid  level appears appropriate for her gestational age.  The previously noted choroid plexus cysts have resolved.  Due to pregestational diabetes, we will start weekly fetal  testing at 32 weeks.  A follow-up growth scan and BPP was scheduled in 4 weeks. ----------------------------------------------------------------------                   Ma Rings, MD Electronically Signed Final Report   05/08/2023 09:37 am  ----------------------------------------------------------------------   [place] [facility] FOLLOW UP  Result Date: 04/10/2023 ----------------------------------------------------------------------  OBSTETRICS REPORT                       (Signed Final 04/10/2023 10:36 am) ---------------------------------------------------------------------- Patient Info  ID #:       [id number]                          D.O.B.:  09-11-1985 (37 yrs)  Name:       Erin Avery                Visit Date: 04/10/2023 09:08 am ---------------------------------------------------------------------- Performed By  Attending:        Braxton Feathers DO       Ref. Address:     [address]                                                             [place], [place]                                                             [address]  Performed By:     Eden Lathe BS      Location:         [facility]                    RDMS RVT                                 Fetal Care at  PRENATAL VISIT NOTE  Subjective:  Erin Avery is a 37 y.o. U2V2536 at [redacted]w[redacted]d being seen today for ongoing prenatal care.  She is currently monitored for the following issues for this high-risk pregnancy and has Supervision of high-risk pregnancy; History of cesarean delivery; Diabetes in pregnancy; AMA (advanced maternal age) multigravida 35+; Obesity affecting pregnancy; Choroid plexus cyst of fetus affecting care of mother, antepartum; and Rash on their problem list.  Patient reports no complaints.  Contractions: Not present. Vag. Bleeding: None.  Movement: Present. Denies leaking of fluid.   The following portions of the patient's history were reviewed and updated as appropriate: allergies, current medications, past family history, past medical history, past social history, past surgical history and problem list.   Objective:   Vitals:   05/10/23 0908  BP: 93/65  Pulse: 96  Weight: 148 lb (67.1 kg)    Fetal Status: Fetal Heart Rate (bpm): 143   Movement: Present     General:  Alert, oriented and cooperative. Patient is in no acute distress.  Skin: Skin is warm and dry. No rash noted.   Cardiovascular: Normal heart rate noted  Respiratory: Normal respiratory effort, no problems with respiration noted  Abdomen: Soft, gravid, appropriate for gestational age.  Pain/Pressure: Present     Pelvic: Cervical exam deferred        Extremities: Normal range of motion.  Edema: None  Mental Status: Normal mood and affect. Normal behavior. Normal judgment and thought content.   [place] [facility] FOLLOW UP  Result Date: 05/08/2023 ----------------------------------------------------------------------  OBSTETRICS REPORT                       (Signed Final 05/08/2023 09:37 am) ---------------------------------------------------------------------- Patient Info  ID #:       [id number]                          D.O.B.:  1986/02/16 (37 yrs)  Name:       Erin Avery                Visit Date: 05/08/2023  07:19 am ---------------------------------------------------------------------- Performed By  Attending:        Ma Rings MD         Ref. Address:     [address]                                                             [place], [place]                                                             [address]  Performed By:     Isac Sarna        [facility]         [facility]                    BS RDMS  PRENATAL VISIT NOTE  Subjective:  Erin Avery is a 37 y.o. U2V2536 at [redacted]w[redacted]d being seen today for ongoing prenatal care.  She is currently monitored for the following issues for this high-risk pregnancy and has Supervision of high-risk pregnancy; History of cesarean delivery; Diabetes in pregnancy; AMA (advanced maternal age) multigravida 35+; Obesity affecting pregnancy; Choroid plexus cyst of fetus affecting care of mother, antepartum; and Rash on their problem list.  Patient reports no complaints.  Contractions: Not present. Vag. Bleeding: None.  Movement: Present. Denies leaking of fluid.   The following portions of the patient's history were reviewed and updated as appropriate: allergies, current medications, past family history, past medical history, past social history, past surgical history and problem list.   Objective:   Vitals:   05/10/23 0908  BP: 93/65  Pulse: 96  Weight: 148 lb (67.1 kg)    Fetal Status: Fetal Heart Rate (bpm): 143   Movement: Present     General:  Alert, oriented and cooperative. Patient is in no acute distress.  Skin: Skin is warm and dry. No rash noted.   Cardiovascular: Normal heart rate noted  Respiratory: Normal respiratory effort, no problems with respiration noted  Abdomen: Soft, gravid, appropriate for gestational age.  Pain/Pressure: Present     Pelvic: Cervical exam deferred        Extremities: Normal range of motion.  Edema: None  Mental Status: Normal mood and affect. Normal behavior. Normal judgment and thought content.   [place] [facility] FOLLOW UP  Result Date: 05/08/2023 ----------------------------------------------------------------------  OBSTETRICS REPORT                       (Signed Final 05/08/2023 09:37 am) ---------------------------------------------------------------------- Patient Info  ID #:       [id number]                          D.O.B.:  1986/02/16 (37 yrs)  Name:       Erin Avery                Visit Date: 05/08/2023  07:19 am ---------------------------------------------------------------------- Performed By  Attending:        Ma Rings MD         Ref. Address:     [address]                                                             [place], [place]                                                             [address]  Performed By:     Isac Sarna        [facility]                    BS RDMS  PRENATAL VISIT NOTE  Subjective:  Erin Avery is a 37 y.o. U2V2536 at [redacted]w[redacted]d being seen today for ongoing prenatal care.  She is currently monitored for the following issues for this high-risk pregnancy and has Supervision of high-risk pregnancy; History of cesarean delivery; Diabetes in pregnancy; AMA (advanced maternal age) multigravida 35+; Obesity affecting pregnancy; Choroid plexus cyst of fetus affecting care of mother, antepartum; and Rash on their problem list.  Patient reports no complaints.  Contractions: Not present. Vag. Bleeding: None.  Movement: Present. Denies leaking of fluid.   The following portions of the patient's history were reviewed and updated as appropriate: allergies, current medications, past family history, past medical history, past social history, past surgical history and problem list.   Objective:   Vitals:   05/10/23 0908  BP: 93/65  Pulse: 96  Weight: 148 lb (67.1 kg)    Fetal Status: Fetal Heart Rate (bpm): 143   Movement: Present     General:  Alert, oriented and cooperative. Patient is in no acute distress.  Skin: Skin is warm and dry. No rash noted.   Cardiovascular: Normal heart rate noted  Respiratory: Normal respiratory effort, no problems with respiration noted  Abdomen: Soft, gravid, appropriate for gestational age.  Pain/Pressure: Present     Pelvic: Cervical exam deferred        Extremities: Normal range of motion.  Edema: None  Mental Status: Normal mood and affect. Normal behavior. Normal judgment and thought content.   [place] [facility] FOLLOW UP  Result Date: 05/08/2023 ----------------------------------------------------------------------  OBSTETRICS REPORT                       (Signed Final 05/08/2023 09:37 am) ---------------------------------------------------------------------- Patient Info  ID #:       [id number]                          D.O.B.:  1986/02/16 (37 yrs)  Name:       Erin Avery                Visit Date: 05/08/2023  07:19 am ---------------------------------------------------------------------- Performed By  Attending:        Ma Rings MD         Ref. Address:     [address]                                                             [place], [place]                                                             [address]  Performed By:     Isac Sarna        [facility]                    BS RDMS  Previously seen        Upper Extremities:      Previously seen  RVOT:                  Previously seen        Lower Extremities:      Previously seen  Other:  VC, 3VV, 3VTV, heels/feet, Nasal bone, lenses, maxilla, mandible,          falx, and hands previously visualized. Female gender previously          seen. Technically difficult due to fetal position. ---------------------------------------------------------------------- Comments  This patient was seen for a follow up growth scan due to  pregestational diabetes treated with metformin and maternal  obesity with a BMI of 34.  She denies any problems since her  last exam.  She had a normal fetal echocardiogram performed with [facility].  She was informed that the fetal growth and amniotic fluid  level appears appropriate for her gestational age.  The previously noted choroid plexus cysts have resolved.  Due to pregestational diabetes, we will start weekly fetal  testing at 32 weeks.  A follow-up growth scan and BPP was scheduled in 4 weeks. ----------------------------------------------------------------------                   Ma Rings, MD Electronically Signed Final Report   05/08/2023 09:37 am  ----------------------------------------------------------------------   [place] [facility] FOLLOW UP  Result Date: 04/10/2023 ----------------------------------------------------------------------  OBSTETRICS REPORT                       (Signed Final 04/10/2023 10:36 am) ---------------------------------------------------------------------- Patient Info  ID #:       [id number]                          D.O.B.:  09-11-1985 (37 yrs)  Name:       Erin Avery                Visit Date: 04/10/2023 09:08 am ---------------------------------------------------------------------- Performed By  Attending:        Braxton Feathers DO       Ref. Address:     [address]                                                             [place], [place]                                                             [address]  Performed By:     Eden Lathe BS      Location:         [facility]                    RDMS RVT                                 Fetal Care at  PRENATAL VISIT NOTE  Subjective:  Erin Avery is a 37 y.o. U2V2536 at [redacted]w[redacted]d being seen today for ongoing prenatal care.  She is currently monitored for the following issues for this high-risk pregnancy and has Supervision of high-risk pregnancy; History of cesarean delivery; Diabetes in pregnancy; AMA (advanced maternal age) multigravida 35+; Obesity affecting pregnancy; Choroid plexus cyst of fetus affecting care of mother, antepartum; and Rash on their problem list.  Patient reports no complaints.  Contractions: Not present. Vag. Bleeding: None.  Movement: Present. Denies leaking of fluid.   The following portions of the patient's history were reviewed and updated as appropriate: allergies, current medications, past family history, past medical history, past social history, past surgical history and problem list.   Objective:   Vitals:   05/10/23 0908  BP: 93/65  Pulse: 96  Weight: 148 lb (67.1 kg)    Fetal Status: Fetal Heart Rate (bpm): 143   Movement: Present     General:  Alert, oriented and cooperative. Patient is in no acute distress.  Skin: Skin is warm and dry. No rash noted.   Cardiovascular: Normal heart rate noted  Respiratory: Normal respiratory effort, no problems with respiration noted  Abdomen: Soft, gravid, appropriate for gestational age.  Pain/Pressure: Present     Pelvic: Cervical exam deferred        Extremities: Normal range of motion.  Edema: None  Mental Status: Normal mood and affect. Normal behavior. Normal judgment and thought content.   [place] [facility] FOLLOW UP  Result Date: 05/08/2023 ----------------------------------------------------------------------  OBSTETRICS REPORT                       (Signed Final 05/08/2023 09:37 am) ---------------------------------------------------------------------- Patient Info  ID #:       [id number]                          D.O.B.:  1986/02/16 (37 yrs)  Name:       Erin Avery                Visit Date: 05/08/2023  07:19 am ---------------------------------------------------------------------- Performed By  Attending:        Ma Rings MD         Ref. Address:     [address]                                                             [place], [place]                                                             [address]  Performed By:     Isac Sarna        [facility]                    BS RDMS  PRENATAL VISIT NOTE  Subjective:  Erin Avery is a 37 y.o. U2V2536 at [redacted]w[redacted]d being seen today for ongoing prenatal care.  She is currently monitored for the following issues for this high-risk pregnancy and has Supervision of high-risk pregnancy; History of cesarean delivery; Diabetes in pregnancy; AMA (advanced maternal age) multigravida 35+; Obesity affecting pregnancy; Choroid plexus cyst of fetus affecting care of mother, antepartum; and Rash on their problem list.  Patient reports no complaints.  Contractions: Not present. Vag. Bleeding: None.  Movement: Present. Denies leaking of fluid.   The following portions of the patient's history were reviewed and updated as appropriate: allergies, current medications, past family history, past medical history, past social history, past surgical history and problem list.   Objective:   Vitals:   05/10/23 0908  BP: 93/65  Pulse: 96  Weight: 148 lb (67.1 kg)    Fetal Status: Fetal Heart Rate (bpm): 143   Movement: Present     General:  Alert, oriented and cooperative. Patient is in no acute distress.  Skin: Skin is warm and dry. No rash noted.   Cardiovascular: Normal heart rate noted  Respiratory: Normal respiratory effort, no problems with respiration noted  Abdomen: Soft, gravid, appropriate for gestational age.  Pain/Pressure: Present     Pelvic: Cervical exam deferred        Extremities: Normal range of motion.  Edema: None  Mental Status: Normal mood and affect. Normal behavior. Normal judgment and thought content.   [place] [facility] FOLLOW UP  Result Date: 05/08/2023 ----------------------------------------------------------------------  OBSTETRICS REPORT                       (Signed Final 05/08/2023 09:37 am) ---------------------------------------------------------------------- Patient Info  ID #:       [id number]                          D.O.B.:  1986/02/16 (37 yrs)  Name:       Erin Avery                Visit Date: 05/08/2023  07:19 am ---------------------------------------------------------------------- Performed By  Attending:        Ma Rings MD         Ref. Address:     [address]                                                             [place], [place]                                                             [address]  Performed By:     Isac Sarna        [facility]                    BS RDMS  PRENATAL VISIT NOTE  Subjective:  Erin Avery is a 37 y.o. U2V2536 at [redacted]w[redacted]d being seen today for ongoing prenatal care.  She is currently monitored for the following issues for this high-risk pregnancy and has Supervision of high-risk pregnancy; History of cesarean delivery; Diabetes in pregnancy; AMA (advanced maternal age) multigravida 35+; Obesity affecting pregnancy; Choroid plexus cyst of fetus affecting care of mother, antepartum; and Rash on their problem list.  Patient reports no complaints.  Contractions: Not present. Vag. Bleeding: None.  Movement: Present. Denies leaking of fluid.   The following portions of the patient's history were reviewed and updated as appropriate: allergies, current medications, past family history, past medical history, past social history, past surgical history and problem list.   Objective:   Vitals:   05/10/23 0908  BP: 93/65  Pulse: 96  Weight: 148 lb (67.1 kg)    Fetal Status: Fetal Heart Rate (bpm): 143   Movement: Present     General:  Alert, oriented and cooperative. Patient is in no acute distress.  Skin: Skin is warm and dry. No rash noted.   Cardiovascular: Normal heart rate noted  Respiratory: Normal respiratory effort, no problems with respiration noted  Abdomen: Soft, gravid, appropriate for gestational age.  Pain/Pressure: Present     Pelvic: Cervical exam deferred        Extremities: Normal range of motion.  Edema: None  Mental Status: Normal mood and affect. Normal behavior. Normal judgment and thought content.   [place] [facility] OB FOLLOW UP  Result Date: 05/08/2023 ----------------------------------------------------------------------  OBSTETRICS REPORT                       (Signed Final 05/08/2023 09:37 am) ---------------------------------------------------------------------- Patient Info  ID #:       [id number]                          D.O.B.:  1986/02/16 (37 yrs)  Name:       Erin Avery                Visit Date: 05/08/2023  07:19 am ---------------------------------------------------------------------- Performed By  Attending:        Ma Rings MD         Ref. Address:     [address]                                                             [place], [place]                                                             [address]  Performed By:     Isac Sarna        Location:         [facility]                    BS RDMS

## 2023-05-10 NOTE — Patient Instructions (Signed)
Return to office for any scheduled appointments. Call the office or go to the MAU at [facility] if: You begin to have strong, frequent contractions Your water breaks.  Sometimes it is a big gush of fluid, sometimes it is just a trickle that keeps getting your underwear wet or running down your legs You have vaginal bleeding.  It is normal to have a small amount of spotting if your cervix was checked.  You do not feel your baby moving like normal.  If you do not, get something to eat and drink and lay down and focus on feeling your baby move.   If your baby is still not moving like normal, you should call the office or go to MAU. Any other obstetric concerns.  TDaP Vaccine Pregnancy Get the Whooping Cough Vaccine While You Are Pregnant (CDC)  It is important for women to get the whooping cough vaccine in the third trimester of each pregnancy. Vaccines are the best way to prevent this disease. There are 2 different whooping cough vaccines. Both vaccines combine protection against whooping cough, tetanus and diphtheria, but they are for different age groups: Tdap: for everyone 11 years or older, including pregnant women  DTaP: for children 2 months through 74 years of age  You need the whooping cough vaccine during each of your pregnancies The recommended time to get the shot is during your 27th through 36th week of pregnancy, preferably during the earlier part of this time period. The Centers for Disease Control and Prevention (CDC) recommends that pregnant women receive the whooping cough vaccine for adolescents and adults (called Tdap vaccine) during the third trimester of each pregnancy. The recommended time to get the shot is during your 27th through 36th week of pregnancy, preferably during the earlier part of this time period. This replaces the original recommendation that pregnant women get the vaccine only if they had not previously received it. The [employer] and the [employer] support this recommendation.  You should get the whooping cough vaccine while pregnant to pass protection to your baby frame support disabled and/or not supported in this browser  Learn why Vernona Rieger decided to get the whooping cough vaccine in her 3rd trimester of pregnancy and how her baby girl was born with some protection against the disease. Also available on YouTube. After receiving the whooping cough vaccine, your body will create protective antibodies (proteins produced by the body to fight off diseases) and pass some of them to your baby before birth. These antibodies provide your baby some short-term protection against whooping cough in early life. These antibodies can also protect your baby from some of the more serious complications that come along with whooping cough. Your protective antibodies are at their highest about 2 weeks after getting the vaccine, but it takes time to pass them to your baby. So the preferred time to get the whooping cough vaccine is early in your third trimester. The amount of whooping cough antibodies in your body decreases over time. That is why CDC recommends you get a whooping cough vaccine during each pregnancy. Doing so allows each of your babies to get the greatest number of protective antibodies from you. This means each of your babies will get the best protection possible against this disease.  Getting the whooping cough vaccine while pregnant is better than getting the vaccine after you give birth Whooping cough vaccination during pregnancy is ideal so your  baby will have short-term protection as soon as he is born. This early protection is important because your baby will not start getting his whooping cough vaccines until he is 2 months old. These first few months of life are when your baby is at greatest risk for catching whooping cough. This is also when he's at greatest  risk for having severe, potentially life-threating complications from the infection. To avoid that gap in protection, it is best to get a whooping cough vaccine during pregnancy. You will then pass protection to your baby before he is born. To continue protecting your baby, he should get whooping cough vaccines starting at 2 months old. You may never have gotten the Tdap vaccine before and did not get it during this pregnancy. If so, you should make sure to get the vaccine immediately after you give birth, before leaving the hospital or birthing center. It will take about 2 weeks before your body develops protection (antibodies) in response to the vaccine. Once you have protection from the vaccine, you are less likely to give whooping cough to your newborn while caring for him. But remember, your baby will still be at risk for catching whooping cough from others. A recent study looked to see how effective Tdap was at preventing whooping cough in babies whose mothers got the vaccine while pregnant or in the hospital after giving birth. The study found that getting Tdap between 27 through 36 weeks of pregnancy is 85% more effective at preventing whooping cough in babies younger than 2 months old. Blood tests cannot tell if you need a whooping cough vaccine There are no blood tests that can tell you if you have enough antibodies in your body to protect yourself or your baby against whooping cough. Even if you have been sick with whooping cough in the past or previously received the vaccine, you still should get the vaccine during each pregnancy. Breastfeeding may pass some protective antibodies onto your baby By breastfeeding, you may pass some antibodies you have made in response to the vaccine to your baby. When you get a whooping cough vaccine during your pregnancy, you will have antibodies in your breast milk that you can share with your baby as soon as your milk comes in. However, your baby will not get  protective antibodies immediately if you wait to get the whooping cough vaccine until after delivering your baby. This is because it takes about 2 weeks for your body to create antibodies. Learn more about the health benefits of breastfeeding.    RSV Vaccination for Pregnant People  CDC recommends two ways to protect babies from getting very sick with Respiratory Syncytial Virus (RSV):  An RSV vaccination given during pregnancy  Pfizer's vaccine Verdis Frederickson) is recommended for use during pregnancy. It is given during RSV season to people who are 32 through [redacted] weeks pregnant.  Or, An RSV immunization given directly to infants and some older babies  Babies born to mothers who get RSV vaccine at least 2 weeks before delivery will have protection and, in most cases, should not need an RSV immunization later.    When is RSV season?  In most regions of the [place] RSV season starts in the fall and peaks in the winter, but the timing and severity of RSV season can vary from place to place and year to year.   The goal of maternal RSV vaccination is to protect babies from getting very sick with RSV during their first RSV season.  In most of the [place], this means maternal RSV vaccine will be given in September through January.  Who should get the maternal RSV vaccine?  People who are 38 through [redacted] weeks pregnant during September through January should get one dose of maternal RSV vaccine to protect their babies. RSV season can vary around the country.   How is the maternal RSV vaccine administered?  Maternal RSV vaccine is given as a shot into the mother's upper arm. Only a single dose (one shot) of maternal RSV vaccine is recommended.   It is not yet known whether another dose might be needed in later pregnancies.  How well does the maternal RSV vaccine work?  When someone gets RSV vaccine, their body responds by making a protein that protects against the virus that  causes RSV. The process takes about 2 weeks. When a pregnant person gets RSV vaccine, their protective proteins (called antibodies) also pass to their baby. So, babies who are born at least 2 weeks after their mother gets RSV vaccine are protected at birth, when infants are at the highest risk of severe RSV disease.   The vaccine can reduce a baby's risk of being hospitalized from RSV by 57% in the first six months after birth.  What are the possible side effects of the maternal RSV vaccine?  In the clinical trials, the side effects most often reported by pregnant people who received the maternal RSV vaccine were pain at the injection site, headache, muscle pain, and nausea.  Although not common, a dangerous high blood pressure condition called pre-eclampsia occurred in 1.8% of pregnant people who received the maternal RSV vaccine compared to 1.4% of pregnant people who received a placebo.  The clinical trials identified a small increase in the number of preterm births in vaccinated pregnant people. It is not clear if this is a true safety problem related to RSV vaccine or if this occurred for reasons unrelated to vaccination.  To reduce the potential risk of preterm birth and complications from RSV disease, FDA approved the maternal RSV vaccine for use during weeks 32 through 103 of pregnancy while additional studies are conducted.  FDA is requiring the manufacturer to do additional studies that will look more closely at the potential risk of preterm births and pregnancy-related high blood pressure issues in mothers, including pre-eclampsia.  Severe allergic reactions to vaccines are rare but can happen after any vaccine and can be life-threatening. If you see signs of a severe allergic reaction after vaccination (hives, swelling of the face and throat, difficulty breathing, a fast heartbeat, dizziness, or weakness), seek immediate medical care by calling 911.  As with any medicine or vaccine there  is a very remote chance of the vaccine causing other serious injury or death after vaccination.  Adverse events following vaccination should be reported to the Vaccine Adverse Event Reporting System (VAERS), even if it's not clear that the vaccine caused the adverse event. You or your doctor can report an adverse event to [facility] and FDA through VAERS. If you need further assistance reporting to VAERS, please email [email or web address] or call [phone].  If you have any questions about side effects from the maternal RSV vaccine, talk with your healthcare provider.  Do I need a prescription for a maternal RSV vaccine?  Until the vaccine available in the office, you will need a prescription to take to a local pharmacy that is providing the vaccine.   How do I pay for the maternal RSV vaccine?  Most private health insurance plans cover the maternal RSV vaccine, but there may be a cost to you depending on your plan.  Contact your insurer to find out.  Medicaid Beginning May 15, 2022, most people with coverage from [facility] and [employer] [facility]) will be guaranteed coverage of all vaccines recommended by the Advisory Committee on Immunization Practice at no cost to them.   Source: [facility] for Immunization and Respiratory Diseases

## 2023-05-11 LAB — HEMOGLOBIN A1C
Est. average glucose Bld gHb Est-mCnc: 105 mg/dL
Hgb A1c MFr Bld: 5.3 % (ref 4.8–5.6)

## 2023-05-11 LAB — HIV ANTIBODY (ROUTINE TESTING W REFLEX): HIV Screen 4th Generation wRfx: NONREACTIVE

## 2023-05-18 ENCOUNTER — Encounter: Payer: Self-pay | Admitting: Dermatology

## 2023-05-18 ENCOUNTER — Ambulatory Visit: Payer: Medicaid Other | Admitting: Dermatology

## 2023-05-18 VITALS — BP 102/65 | HR 91

## 2023-05-18 DIAGNOSIS — R21 Rash and other nonspecific skin eruption: Secondary | ICD-10-CM

## 2023-05-18 DIAGNOSIS — O2686 Pruritic urticarial papules and plaques of pregnancy (PUPPP): Secondary | ICD-10-CM

## 2023-05-18 DIAGNOSIS — Z8632 Personal history of gestational diabetes: Secondary | ICD-10-CM | POA: Insufficient documentation

## 2023-05-18 DIAGNOSIS — O24419 Gestational diabetes mellitus in pregnancy, unspecified control: Secondary | ICD-10-CM | POA: Insufficient documentation

## 2023-05-18 MED ORDER — TRIAMCINOLONE ACETONIDE 0.1 % EX OINT
1.0000 | TOPICAL_OINTMENT | Freq: Every day | CUTANEOUS | 3 refills | Status: DC
Start: 2023-05-18 — End: 2023-07-15

## 2023-05-18 NOTE — Patient Instructions (Addendum)
Thank you for visiting [place] today. We appreciate your commitment to managing your health during your pregnancy. Here is a summary of the key instructions from today's consultation:  - Triamcinolone 0.1% Cream:   - Application: Apply twice daily to itchy areas only.   - Duration: Do not exceed two weeks in the same area to avoid skin thinning.  - CeraVe Anti-Itch Lotion:   - Usage: Safe for daily use on your entire body to control itchiness. Contains pramoxine.   - Note: We provided you with samples to get started.  - Colloidal Oatmeal Baths:   - Product: Aveeno oatmeal baths recommended.   - Frequency: Use as needed to help relieve itchiness.  - Antihistamines:   - Medication: Hydroxyzine may be used as a last resort if topical treatments are insufficient.  - Educational Material:   - Included: A picture of the Aveeno oatmeal bath product in your after-visit summary for your reference.  Please remember to monitor the affected areas and return if symptoms persist or worsen. We are here to support you throughout your pregnancy and ensure your comfort.  Wishing you a safe and healthy pregnancy.  Best regards,  Dr. Langston Reusing,  Dermatology     Important Information   Due to recent changes in healthcare laws, you may see results of your pathology and/or laboratory studies on MyChart before the doctors have had a chance to review them. We understand that in some cases there may be results that are confusing or concerning to you. Please understand that not all results are received at the same time and often the doctors may need to interpret multiple results in order to provide you with the best plan of care or course of treatment. Therefore, we ask that you please give [place] 2 business days to thoroughly review all your results before contacting the office for clarification. Should we see a critical lab result, you will be contacted sooner.     If You Need Anything After Your Visit    If you have any questions or concerns for your doctor, please call our main line at [phone]. If no one answers, please leave a voicemail as directed and we will return your call as soon as possible. Messages left after 4 pm will be answered the following business day.    You may also send [place] a message via MyChart. We typically respond to MyChart messages within 1-2 business days.  For prescription refills, please ask your pharmacy to contact our office. Our fax number is [phone].  If you have an urgent issue when the clinic is closed that cannot wait until the next business day, you can page your doctor at the number below.     Please note that while we do our best to be available for urgent issues outside of office hours, we are not available 24/7.    If you have an urgent issue and are unable to reach [place], you may choose to seek medical care at your doctor's office, retail clinic, urgent care center, or emergency room.   If you have a medical emergency, please immediately call 911 or go to the emergency department. In the event of inclement weather, please call our main line at [phone] for an update on the status of any delays or closures.  Dermatology Medication Tips: Please keep the boxes that topical medications come in in order to help keep track of the instructions about where and how to use these. Pharmacies typically print the  medication instructions only on the boxes and not directly on the medication tubes.   If your medication is too expensive, please contact our office at [phone] or send [place] a message through MyChart.    We are unable to tell what your co-pay for medications will be in advance as this is different depending on your insurance coverage. However, we may be able to find a substitute medication at lower cost or fill out paperwork to get insurance to cover a needed medication.    If a prior authorization is required to get your medication covered by  your insurance company, please allow [place] 1-2 business days to complete this process.   Drug prices often vary depending on where the prescription is filled and some pharmacies may offer cheaper prices.   The website www.goodrx.com contains coupons for medications through different pharmacies. The prices here do not account for what the cost may be with help from insurance (it may be cheaper with your insurance), but the website can give you the price if you did not use any insurance.  - You can print the associated coupon and take it with your prescription to the pharmacy.  - You may also stop by our office during regular business hours and pick up a GoodRx coupon card.  - If you need your prescription sent electronically to a different pharmacy, notify our office through [facility] or by phone at [phone]

## 2023-05-18 NOTE — Progress Notes (Signed)
   New Patient Visit   Subjective  Erin Avery is a 37 y.o. female who presents for the following: Rash  Patient states she has rash located at the scattered that she would like to have examined. Patient reports the areas have been there for 3 months. She reports the areas are bothersome. Patient reports the rash is itchy. Patient rates irritation 10 out of 10. She states that the areas have spread. Patient reports she has previously been treated for these areas. Patient denies Hx of bx. Patient denies family history of skin cancer(s). Patient reports she is unsure of the trigger. She denies changes of detergents, soaps, etc.  The following portions of the chart were reviewed this encounter and updated as appropriate: medications, allergies, medical history  Review of Systems:  No other skin or systemic complaints except as noted in HPI or Assessment and Plan.  Objective  Well appearing patient in no apparent distress; mood and affect are within normal limits.  A full examination was performed including scalp, head, eyes, ears, nose, lips, neck, chest, axillae, abdomen, back, buttocks, bilateral upper extremities, bilateral lower extremities, hands, feet, fingers, toes, fingernails, and toenails. All findings within normal limits unless otherwise noted below.   Relevant exam findings are noted in the Assessment and Plan.    Assessment & Plan   Pruritic urticarial papules of pregnancy (PUPP) Exam:  Post inflammatory hyerpigemntaion at pink urticaria papule on B/L LE, Abdomen and lower back  Treatment Plan: -Reassured pt that this is a self limiting condition related to pregnancy  -We will prescribe TMC, to apply to the area BID for up to 2 weeks then stop (use sparingly) - We recommend using Cerave Anti itch products daily - Recommended using Aveno Oatmeal baths -      No follow-ups on file.    Documentation: I have reviewed the above documentation for accuracy and  completeness, and I agree with the above.  Stasia Cavalier, am acting as scribe for Langston Reusing, DO.   Langston Reusing, DO

## 2023-05-19 ENCOUNTER — Ambulatory Visit: Payer: Medicaid Other | Admitting: Internal Medicine

## 2023-05-29 ENCOUNTER — Other Ambulatory Visit: Payer: Self-pay

## 2023-05-29 ENCOUNTER — Ambulatory Visit (INDEPENDENT_AMBULATORY_CARE_PROVIDER_SITE_OTHER): Payer: Medicaid Other | Admitting: Obstetrics and Gynecology

## 2023-05-29 VITALS — BP 107/64 | HR 98 | Wt 150.1 lb

## 2023-05-29 DIAGNOSIS — O3503X0 Maternal care for (suspected) central nervous system malformation or damage in fetus, choroid plexus cysts, not applicable or unspecified: Secondary | ICD-10-CM

## 2023-05-29 DIAGNOSIS — O09523 Supervision of elderly multigravida, third trimester: Secondary | ICD-10-CM

## 2023-05-29 DIAGNOSIS — Z98891 History of uterine scar from previous surgery: Secondary | ICD-10-CM

## 2023-05-29 DIAGNOSIS — Z3A31 31 weeks gestation of pregnancy: Secondary | ICD-10-CM

## 2023-05-29 DIAGNOSIS — O24415 Gestational diabetes mellitus in pregnancy, controlled by oral hypoglycemic drugs: Secondary | ICD-10-CM

## 2023-05-29 DIAGNOSIS — O0993 Supervision of high risk pregnancy, unspecified, third trimester: Secondary | ICD-10-CM

## 2023-05-29 NOTE — Progress Notes (Signed)
   PRENATAL VISIT NOTE  Subjective:  Erin Avery is a 37 y.o. [id number] at [redacted]w[redacted]d being seen today for ongoing prenatal care.  She is currently monitored for the following issues for this high-risk pregnancy and has Supervision of high-risk pregnancy; History of cesarean delivery; Diabetes in pregnancy; AMA (advanced maternal age) multigravida 35+; Obesity affecting pregnancy; Choroid plexus cyst of fetus affecting care of mother, antepartum; Rash; and Gestational diabetes on their problem list.  Patient doing well with no acute concerns today. She reports no complaints.  Contractions: Not present. Vag. Bleeding: None.  Movement: Present. Denies leaking of fluid.   The following portions of the patient's history were reviewed and updated as appropriate: allergies, current medications, past family history, past medical history, past social history, past surgical history and problem list. Problem list updated.  Objective:   Vitals:   05/29/23 1141  BP: 107/64  Pulse: 98  Weight: 150 lb 1.6 oz (68.1 kg)    Fetal Status: Fetal Heart Rate (bpm): 142 Fundal Height: 31 cm Movement: Present     General:  Alert, oriented and cooperative. Patient is in no acute distress.  Skin: Skin is warm and dry. No rash noted.   Cardiovascular: Normal heart rate noted  Respiratory: Normal respiratory effort, no problems with respiration noted  Abdomen: Soft, gravid, appropriate for gestational age.  Pain/Pressure: Absent     Pelvic: Cervical exam deferred        Extremities: Normal range of motion.  Edema: None  Mental Status:  Normal mood and affect. Normal behavior. Normal judgment and thought content.   Assessment and Plan:  Pregnancy: G4P1021 at [redacted]w[redacted]d  1. [redacted] weeks gestation of pregnancy   2. Gestational diabetes mellitus (GDM) in third trimester controlled on oral hypoglycemic drug FBS: 94 PPBS: 119-125 Pt is using info from her CGM, but it is hard to see her daily trends, will monitor closely,  but may need to increase metformin in the evening or start insulin  3. Choroid plexus cyst of fetus affecting care of mother, antepartum, single or unspecified fetus Appears to have resolved  4. Multigravida of advanced maternal age in third trimester   5. History of cesarean delivery Pt desires repeat c section, but is unsure regarding the timing.  Currently, scheduled for 07/20/23  6. Supervision of high risk pregnancy in third trimester Continue routine prenatal care  Preterm labor symptoms and general obstetric precautions including but not limited to vaginal bleeding, contractions, leaking of fluid and fetal movement were reviewed in detail with the patient.  Please refer to After Visit Summary for other counseling recommendations.   Return in about 2 weeks (around 06/12/2023) for [facility], in person.   Mariel Aloe, MD Faculty Attending Center for [facility]

## 2023-06-05 ENCOUNTER — Ambulatory Visit: Payer: Medicaid Other | Attending: Obstetrics

## 2023-06-05 ENCOUNTER — Ambulatory Visit: Payer: Medicaid Other

## 2023-06-05 ENCOUNTER — Other Ambulatory Visit: Payer: Self-pay

## 2023-06-05 DIAGNOSIS — Z7984 Long term (current) use of oral hypoglycemic drugs: Secondary | ICD-10-CM | POA: Diagnosis not present

## 2023-06-05 DIAGNOSIS — E119 Type 2 diabetes mellitus without complications: Secondary | ICD-10-CM

## 2023-06-05 DIAGNOSIS — Z3A32 32 weeks gestation of pregnancy: Secondary | ICD-10-CM

## 2023-06-05 DIAGNOSIS — E669 Obesity, unspecified: Secondary | ICD-10-CM | POA: Diagnosis not present

## 2023-06-05 DIAGNOSIS — O09523 Supervision of elderly multigravida, third trimester: Secondary | ICD-10-CM

## 2023-06-05 DIAGNOSIS — O24113 Pre-existing diabetes mellitus, type 2, in pregnancy, third trimester: Secondary | ICD-10-CM

## 2023-06-05 DIAGNOSIS — O99213 Obesity complicating pregnancy, third trimester: Secondary | ICD-10-CM

## 2023-06-05 DIAGNOSIS — O34219 Maternal care for unspecified type scar from previous cesarean delivery: Secondary | ICD-10-CM | POA: Diagnosis not present

## 2023-06-05 DIAGNOSIS — O24912 Unspecified diabetes mellitus in pregnancy, second trimester: Secondary | ICD-10-CM | POA: Insufficient documentation

## 2023-06-12 ENCOUNTER — Other Ambulatory Visit: Payer: Self-pay

## 2023-06-12 ENCOUNTER — Ambulatory Visit: Payer: Medicaid Other

## 2023-06-12 ENCOUNTER — Ambulatory Visit (INDEPENDENT_AMBULATORY_CARE_PROVIDER_SITE_OTHER): Payer: Medicaid Other | Admitting: Obstetrics and Gynecology

## 2023-06-12 ENCOUNTER — Encounter: Payer: Medicaid Other | Admitting: Obstetrics and Gynecology

## 2023-06-12 VITALS — BP 105/73 | HR 98 | Wt 152.0 lb

## 2023-06-12 DIAGNOSIS — O24415 Gestational diabetes mellitus in pregnancy, controlled by oral hypoglycemic drugs: Secondary | ICD-10-CM

## 2023-06-12 DIAGNOSIS — O0993 Supervision of high risk pregnancy, unspecified, third trimester: Secondary | ICD-10-CM

## 2023-06-12 DIAGNOSIS — Z3A33 33 weeks gestation of pregnancy: Secondary | ICD-10-CM

## 2023-06-12 DIAGNOSIS — O09523 Supervision of elderly multigravida, third trimester: Secondary | ICD-10-CM

## 2023-06-12 DIAGNOSIS — Z98891 History of uterine scar from previous surgery: Secondary | ICD-10-CM

## 2023-06-12 DIAGNOSIS — O3503X0 Maternal care for (suspected) central nervous system malformation or damage in fetus, choroid plexus cysts, not applicable or unspecified: Secondary | ICD-10-CM

## 2023-06-12 NOTE — Progress Notes (Signed)
   PRENATAL VISIT NOTE  Subjective:  Erin Avery is a 37 y.o. [id number] at [redacted]w[redacted]d being seen today for ongoing prenatal care.  She is currently monitored for the following issues for this high-risk pregnancy and has Supervision of high-risk pregnancy; History of cesarean delivery; Diabetes in pregnancy; AMA (advanced maternal age) multigravida 35+; Obesity affecting pregnancy; Choroid plexus cyst of fetus affecting care of mother, antepartum; Rash; and Gestational diabetes on their problem list.  Patient doing well with no acute concerns today. She reports no complaints.  Contractions: Not present. Vag. Bleeding: None.  Movement: Present. Denies leaking of fluid.   The following portions of the patient's history were reviewed and updated as appropriate: allergies, current medications, past family history, past medical history, past social history, past surgical history and problem list. Problem list updated.  Objective:   Vitals:   06/12/23 1511  BP: 105/73  Pulse: 98  Weight: 152 lb (68.9 kg)    Fetal Status: Fetal Heart Rate (bpm): 145 Fundal Height: 33 cm Movement: Present     General:  Alert, oriented and cooperative. Patient is in no acute distress.  Skin: Skin is warm and dry. No rash noted.   Cardiovascular: Normal heart rate noted  Respiratory: Normal respiratory effort, no problems with respiration noted  Abdomen: Soft, gravid, appropriate for gestational age.  Pain/Pressure: Absent     Pelvic: Cervical exam deferred        Extremities: Normal range of motion.  Edema: None  Mental Status:  Normal mood and affect. Normal behavior. Normal judgment and thought content.   Assessment and Plan:  Pregnancy: G4P1021 at [redacted]w[redacted]d  1. [redacted] weeks gestation of pregnancy   2. Gestational diabetes mellitus (GDM) in third trimester controlled on oral hypoglycemic drug FBS: 76-90 PPBS: 112-135, most in range Continue current metformin regimen  3. Choroid plexus cyst of fetus affecting  care of mother, antepartum, single or unspecified fetus Resolved per MFM note  4. Multigravida of advanced maternal age in third trimester   5. History of cesarean delivery Repeat c section scheduled for 12/24  6. Supervision of high risk pregnancy in third trimester Continue routine prenatal care  Preterm labor symptoms and general obstetric precautions including but not limited to vaginal bleeding, contractions, leaking of fluid and fetal movement were reviewed in detail with the patient.  Please refer to After Visit Summary for other counseling recommendations.   Return in about 2 weeks (around 06/26/2023) for in person.   Mariel Aloe, MD Faculty Attending Center for [facility]

## 2023-06-14 ENCOUNTER — Other Ambulatory Visit: Payer: Self-pay | Admitting: Obstetrics

## 2023-06-14 ENCOUNTER — Encounter: Payer: Self-pay | Admitting: Obstetrics

## 2023-06-14 ENCOUNTER — Ambulatory Visit: Payer: Medicaid Other | Admitting: *Deleted

## 2023-06-14 ENCOUNTER — Ambulatory Visit: Payer: Medicaid Other

## 2023-06-14 ENCOUNTER — Ambulatory Visit: Payer: Medicaid Other | Attending: Obstetrics

## 2023-06-14 VITALS — BP 116/71

## 2023-06-14 DIAGNOSIS — Z3A33 33 weeks gestation of pregnancy: Secondary | ICD-10-CM

## 2023-06-14 DIAGNOSIS — E669 Obesity, unspecified: Secondary | ICD-10-CM | POA: Diagnosis not present

## 2023-06-14 DIAGNOSIS — O09523 Supervision of elderly multigravida, third trimester: Secondary | ICD-10-CM

## 2023-06-14 DIAGNOSIS — O99213 Obesity complicating pregnancy, third trimester: Secondary | ICD-10-CM | POA: Insufficient documentation

## 2023-06-14 DIAGNOSIS — O24419 Gestational diabetes mellitus in pregnancy, unspecified control: Secondary | ICD-10-CM | POA: Insufficient documentation

## 2023-06-14 DIAGNOSIS — O24113 Pre-existing diabetes mellitus, type 2, in pregnancy, third trimester: Secondary | ICD-10-CM

## 2023-06-14 DIAGNOSIS — O34219 Maternal care for unspecified type scar from previous cesarean delivery: Secondary | ICD-10-CM | POA: Diagnosis not present

## 2023-06-14 DIAGNOSIS — Z7984 Long term (current) use of oral hypoglycemic drugs: Secondary | ICD-10-CM | POA: Diagnosis not present

## 2023-06-14 DIAGNOSIS — O24415 Gestational diabetes mellitus in pregnancy, controlled by oral hypoglycemic drugs: Secondary | ICD-10-CM

## 2023-06-14 DIAGNOSIS — E1169 Type 2 diabetes mellitus with other specified complication: Secondary | ICD-10-CM | POA: Diagnosis not present

## 2023-06-14 NOTE — Procedures (Signed)
Erin Avery 1986-02-21 [redacted]w[redacted]d  Fetus A Non-Stress Test Interpretation for 06/14/23-NST with limited  Indication: Gestational Diabetes medication controlled  Fetal Heart Rate A Mode: External Baseline Rate (A): 140 bpm Variability: Moderate Accelerations: 15 x 15 Decelerations: None Multiple birth?: No  Uterine Activity Mode: Toco Contraction Frequency (min): occas Contraction Duration (sec): 60-70 Contraction Quality: Mild (Pt felt contraction as tightness in her belly) Resting Tone Palpated: Relaxed  Interpretation (Fetal Testing) Nonstress Test Interpretation: Reactive Comments: Tracing reviewed byDr. Grace Bushy

## 2023-06-19 ENCOUNTER — Encounter: Payer: Self-pay | Admitting: *Deleted

## 2023-06-19 ENCOUNTER — Ambulatory Visit: Payer: Medicaid Other | Admitting: *Deleted

## 2023-06-19 ENCOUNTER — Ambulatory Visit: Payer: Medicaid Other | Attending: Obstetrics

## 2023-06-19 ENCOUNTER — Other Ambulatory Visit: Payer: Self-pay | Admitting: Obstetrics

## 2023-06-19 ENCOUNTER — Other Ambulatory Visit: Payer: Self-pay

## 2023-06-19 VITALS — BP 108/66 | HR 89

## 2023-06-19 DIAGNOSIS — Z3A34 34 weeks gestation of pregnancy: Secondary | ICD-10-CM

## 2023-06-19 DIAGNOSIS — O0993 Supervision of high risk pregnancy, unspecified, third trimester: Secondary | ICD-10-CM | POA: Insufficient documentation

## 2023-06-19 DIAGNOSIS — O24113 Pre-existing diabetes mellitus, type 2, in pregnancy, third trimester: Secondary | ICD-10-CM | POA: Insufficient documentation

## 2023-06-19 DIAGNOSIS — E669 Obesity, unspecified: Secondary | ICD-10-CM

## 2023-06-19 DIAGNOSIS — O09523 Supervision of elderly multigravida, third trimester: Secondary | ICD-10-CM | POA: Diagnosis not present

## 2023-06-19 DIAGNOSIS — O34219 Maternal care for unspecified type scar from previous cesarean delivery: Secondary | ICD-10-CM | POA: Diagnosis not present

## 2023-06-19 DIAGNOSIS — E1169 Type 2 diabetes mellitus with other specified complication: Secondary | ICD-10-CM | POA: Diagnosis not present

## 2023-06-19 DIAGNOSIS — O99213 Obesity complicating pregnancy, third trimester: Secondary | ICD-10-CM

## 2023-06-19 DIAGNOSIS — Z7984 Long term (current) use of oral hypoglycemic drugs: Secondary | ICD-10-CM

## 2023-06-19 DIAGNOSIS — O3503X0 Maternal care for (suspected) central nervous system malformation or damage in fetus, choroid plexus cysts, not applicable or unspecified: Secondary | ICD-10-CM | POA: Insufficient documentation

## 2023-06-19 DIAGNOSIS — O24419 Gestational diabetes mellitus in pregnancy, unspecified control: Secondary | ICD-10-CM | POA: Diagnosis not present

## 2023-06-19 NOTE — Procedures (Deleted)
Erin Avery 04-17-1986 [redacted]w[redacted]d  Fetus A Non-Stress Test Interpretation for 06/19/23  Indication: Gestational Diabetes medication controlled, Unsatisfactory BPP, and Choroid Plexus Cyst  Fetal Heart Rate A Mode: External Baseline Rate (A): 130 bpm Variability: Moderate Accelerations: 15 x 15 Decelerations: None Multiple birth?: No  Contractions: One Duration: 120 seconds  Interpretation (Fetal Testing) Nonstress Test Interpretation: Reactive Overall Impression: Reassuring for gestational age Comments: Tracing reviewed by Dr. Darra Lis

## 2023-06-19 NOTE — Procedures (Signed)
Erin Avery 1985/12/12 [redacted]w[redacted]d  Fetus A Non-Stress Test Interpretation for 06/19/23  Indication: Gestational Diabetes medication controlled, Unsatisfactory BPP, and Choroid Plexus Cyst  Fetal Heart Rate A Mode: External Baseline Rate (A): 130 bpm Variability: Moderate Accelerations: 15 x 15 Decelerations: None Multiple birth?: No  Uterine Activity Mode: Toco Contraction Frequency (min): One UC Contraction Duration (sec): 120 Contraction Quality: Mild Resting Tone Palpated: Relaxed Resting Time: Adequate  Interpretation (Fetal Testing) Nonstress Test Interpretation: Reactive Overall Impression: Reassuring for gestational age Comments: Tracing reviewed by Dr. Darra Lis

## 2023-06-26 ENCOUNTER — Ambulatory Visit: Payer: Medicaid Other | Attending: Obstetrics

## 2023-06-26 ENCOUNTER — Other Ambulatory Visit: Payer: Self-pay

## 2023-06-26 ENCOUNTER — Ambulatory Visit (INDEPENDENT_AMBULATORY_CARE_PROVIDER_SITE_OTHER): Payer: Medicaid Other | Admitting: Obstetrics and Gynecology

## 2023-06-26 ENCOUNTER — Ambulatory Visit: Payer: Medicaid Other | Admitting: *Deleted

## 2023-06-26 VITALS — BP 122/73 | HR 92

## 2023-06-26 VITALS — BP 122/73 | HR 92 | Wt 155.1 lb

## 2023-06-26 DIAGNOSIS — O34219 Maternal care for unspecified type scar from previous cesarean delivery: Secondary | ICD-10-CM

## 2023-06-26 DIAGNOSIS — Z3A35 35 weeks gestation of pregnancy: Secondary | ICD-10-CM

## 2023-06-26 DIAGNOSIS — O0993 Supervision of high risk pregnancy, unspecified, third trimester: Secondary | ICD-10-CM | POA: Insufficient documentation

## 2023-06-26 DIAGNOSIS — O99213 Obesity complicating pregnancy, third trimester: Secondary | ICD-10-CM | POA: Diagnosis not present

## 2023-06-26 DIAGNOSIS — O09523 Supervision of elderly multigravida, third trimester: Secondary | ICD-10-CM

## 2023-06-26 DIAGNOSIS — E1169 Type 2 diabetes mellitus with other specified complication: Secondary | ICD-10-CM

## 2023-06-26 DIAGNOSIS — O24113 Pre-existing diabetes mellitus, type 2, in pregnancy, third trimester: Secondary | ICD-10-CM | POA: Insufficient documentation

## 2023-06-26 DIAGNOSIS — Z7984 Long term (current) use of oral hypoglycemic drugs: Secondary | ICD-10-CM

## 2023-06-26 DIAGNOSIS — O24415 Gestational diabetes mellitus in pregnancy, controlled by oral hypoglycemic drugs: Secondary | ICD-10-CM

## 2023-06-26 DIAGNOSIS — E669 Obesity, unspecified: Secondary | ICD-10-CM | POA: Diagnosis not present

## 2023-06-26 DIAGNOSIS — O3503X0 Maternal care for (suspected) central nervous system malformation or damage in fetus, choroid plexus cysts, not applicable or unspecified: Secondary | ICD-10-CM

## 2023-06-26 DIAGNOSIS — Z98891 History of uterine scar from previous surgery: Secondary | ICD-10-CM

## 2023-06-26 NOTE — Progress Notes (Signed)
   PRENATAL VISIT NOTE  Subjective:  Erin Avery is a 37 y.o. F6O1308 at [redacted]w[redacted]d being seen today for ongoing prenatal care.  She is currently monitored for the following issues for this high-risk pregnancy and has Supervision of high-risk pregnancy; History of cesarean delivery; Diabetes in pregnancy; AMA (advanced maternal age) multigravida 35+; Obesity affecting pregnancy; Choroid plexus cyst of fetus affecting care of mother, antepartum; Rash; and Gestational diabetes on their problem list.  Patient doing well with no acute concerns today. She reports no complaints.  Contractions: Not present. Vag. Bleeding: None.  Movement: Present. Denies leaking of fluid.   The following portions of the patient's history were reviewed and updated as appropriate: allergies, current medications, past family history, past medical history, past social history, past surgical history and problem list. Problem list updated.  Objective:   Vitals:   06/26/23 0857  BP: 122/73  Pulse: 92  Weight: 155 lb 1.6 oz (70.4 kg)    Fetal Status: Fetal Heart Rate (bpm): 137   Movement: Present     General:  Alert, oriented and cooperative. Patient is in no acute distress.  Skin: Skin is warm and dry. No rash noted.   Cardiovascular: Normal heart rate noted  Respiratory: Normal respiratory effort, no problems with respiration noted  Abdomen: Soft, gravid, appropriate for gestational age.  Pain/Pressure: Absent     Pelvic: Cervical exam deferred        Extremities: Normal range of motion.  Edema: None  Mental Status:  Normal mood and affect. Normal behavior. Normal judgment and thought content.   Assessment and Plan:  Pregnancy: G4P1021 at [redacted]w[redacted]d  1. [redacted] weeks gestation of pregnancy   2. Gestational diabetes mellitus (GDM) in third trimester controlled on oral hypoglycemic drug FBS: 90-100 PPBS: 125-130  3. Choroid plexus cyst of fetus affecting care of mother, antepartum, single or unspecified fetus No  longer seen  4. Multigravida of advanced maternal age in third trimester   5. History of cesarean delivery Pt desires repeat c section and is scheduled  6. Supervision of high risk pregnancy in third trimester Continue routine prenatal care  Preterm labor symptoms and general obstetric precautions including but not limited to vaginal bleeding, contractions, leaking of fluid and fetal movement were reviewed in detail with the patient.  Please refer to After Visit Summary for other counseling recommendations.   Return in about 1 week (around 07/03/2023) for [facility], in person, 36 weeks swabs.   Mariel Aloe, MD Faculty Attending Center for [facility]

## 2023-07-03 ENCOUNTER — Ambulatory Visit: Payer: Medicaid Other | Attending: Obstetrics

## 2023-07-03 ENCOUNTER — Ambulatory Visit: Payer: Medicaid Other

## 2023-07-03 ENCOUNTER — Telehealth: Payer: Self-pay

## 2023-07-03 DIAGNOSIS — E669 Obesity, unspecified: Secondary | ICD-10-CM | POA: Diagnosis not present

## 2023-07-03 DIAGNOSIS — O24113 Pre-existing diabetes mellitus, type 2, in pregnancy, third trimester: Secondary | ICD-10-CM | POA: Insufficient documentation

## 2023-07-03 DIAGNOSIS — O34219 Maternal care for unspecified type scar from previous cesarean delivery: Secondary | ICD-10-CM

## 2023-07-03 DIAGNOSIS — O09523 Supervision of elderly multigravida, third trimester: Secondary | ICD-10-CM | POA: Diagnosis not present

## 2023-07-03 DIAGNOSIS — Z7984 Long term (current) use of oral hypoglycemic drugs: Secondary | ICD-10-CM

## 2023-07-03 DIAGNOSIS — E1169 Type 2 diabetes mellitus with other specified complication: Secondary | ICD-10-CM

## 2023-07-03 DIAGNOSIS — Z3A36 36 weeks gestation of pregnancy: Secondary | ICD-10-CM | POA: Diagnosis not present

## 2023-07-03 DIAGNOSIS — O99213 Obesity complicating pregnancy, third trimester: Secondary | ICD-10-CM | POA: Diagnosis not present

## 2023-07-03 NOTE — Telephone Encounter (Signed)
Patient came into front office to request medication transfer because [facility] did not have [facility] [place] available. Called pharmacy who confirms this is now available for pick up. Called pt to review. Pt inquires about breast pump process. Reports order was faxed to our office on 11/14. Explained this often takes 2 weeks and encouraged pt to follow up at next OB visit.

## 2023-07-05 ENCOUNTER — Other Ambulatory Visit: Payer: Self-pay

## 2023-07-05 ENCOUNTER — Other Ambulatory Visit (HOSPITAL_COMMUNITY)
Admission: RE | Admit: 2023-07-05 | Discharge: 2023-07-05 | Disposition: A | Discharge status: Home / Self Care | Payer: Medicaid Other | Source: Ambulatory Visit | Attending: Obstetrics & Gynecology | Admitting: Obstetrics & Gynecology

## 2023-07-05 ENCOUNTER — Ambulatory Visit: Payer: Medicaid Other | Admitting: Obstetrics & Gynecology

## 2023-07-05 VITALS — BP 112/75 | HR 100 | Wt 155.0 lb

## 2023-07-05 DIAGNOSIS — Z3A36 36 weeks gestation of pregnancy: Secondary | ICD-10-CM

## 2023-07-05 DIAGNOSIS — Z98891 History of uterine scar from previous surgery: Secondary | ICD-10-CM

## 2023-07-05 DIAGNOSIS — O24419 Gestational diabetes mellitus in pregnancy, unspecified control: Secondary | ICD-10-CM

## 2023-07-05 DIAGNOSIS — O0993 Supervision of high risk pregnancy, unspecified, third trimester: Secondary | ICD-10-CM

## 2023-07-05 DIAGNOSIS — O3503X0 Maternal care for (suspected) central nervous system malformation or damage in fetus, choroid plexus cysts, not applicable or unspecified: Secondary | ICD-10-CM

## 2023-07-05 DIAGNOSIS — Z23 Encounter for immunization: Secondary | ICD-10-CM | POA: Diagnosis not present

## 2023-07-05 DIAGNOSIS — O24415 Gestational diabetes mellitus in pregnancy, controlled by oral hypoglycemic drugs: Secondary | ICD-10-CM

## 2023-07-05 NOTE — Progress Notes (Signed)
   PRENATAL VISIT NOTE  Subjective:  Erin Avery is a 37 y.o. G3O7564 at [redacted]w[redacted]d being seen today for ongoing prenatal care.  She is currently monitored for the following issues for this high-risk pregnancy and has Supervision of high-risk pregnancy; History of cesarean delivery; Diabetes in pregnancy; AMA (advanced maternal age) multigravida 35+; Obesity affecting pregnancy; Choroid plexus cyst of fetus affecting care of mother, antepartum; Rash; and Gestational diabetes on their problem list.  Patient reports no complaints.  Contractions: Irritability. Vag. Bleeding: None.  Movement: Present. Denies leaking of fluid.   The following portions of the patient's history were reviewed and updated as appropriate: allergies, current medications, past family history, past medical history, past social history, past surgical history and problem list.   Objective:   Vitals:   07/05/23 1434  BP: 112/75  Pulse: 100  Weight: 155 lb (70.3 kg)    Fetal Status: Fetal Heart Rate (bpm): 141   Movement: Present     General:  Alert, oriented and cooperative. Patient is in no acute distress.  Skin: Skin is warm and dry. No rash noted.   Cardiovascular: Normal heart rate noted  Respiratory: Normal respiratory effort, no problems with respiration noted  Abdomen: Soft, gravid, appropriate for gestational age.  Pain/Pressure: Present (back pain)     Pelvic: Cervical exam deferred        Extremities: Normal range of motion.  Edema: None  Mental Status: Normal mood and affect. Normal behavior. Normal judgment and thought content.   Assessment and Plan:  Pregnancy: G4P1021 at [redacted]w[redacted]d 1. Supervision of high risk pregnancy in third trimester  - Flu vaccine trivalent PF, 6mos and older(Flulaval,Afluria,Fluarix,Fluzone)  2. Gestational diabetes mellitus (GDM) in third trimester, gestational diabetes method of control unspecified Fair control on Metformin   Media Information  Document Information  Photos     07/05/2023 14:49  Attached To:  Routine Prenatal on 07/05/23 with Adam Phenix, MD  Source Information  Adam Phenix, MD  [facility]  Document History    3. Gestational diabetes mellitus (GDM) in third trimester controlled on oral hypoglycemic drug   4. Choroid plexus cyst of fetus affecting care of mother, antepartum, single or unspecified fetus   5. History of cesarean delivery RCS 07/24/23  Preterm labor symptoms and general obstetric precautions including but not limited to vaginal bleeding, contractions, leaking of fluid and fetal movement were reviewed in detail with the patient. Please refer to After Visit Summary for other counseling recommendations.   Return in about 1 week (around 07/12/2023).  Future Appointments  Date Time Provider Department Center  07/10/2023  8:30 AM [facility] NURSE [facility]  07/10/2023  8:45 AM [facility] NST [facility]  07/17/2023  8:30 AM [facility] NURSE [facility]  07/17/2023  8:45 AM [facility] NST [facility]    Scheryl Darter, MD

## 2023-07-06 LAB — GC/CHLAMYDIA PROBE AMP (~~LOC~~) NOT AT ARMC
Chlamydia: NEGATIVE
Comment: NEGATIVE
Comment: NORMAL
Neisseria Gonorrhea: NEGATIVE

## 2023-07-09 LAB — CULTURE, BETA STREP (GROUP B ONLY): Strep Gp B Culture: NEGATIVE

## 2023-07-10 ENCOUNTER — Ambulatory Visit: Payer: Medicaid Other | Admitting: *Deleted

## 2023-07-10 ENCOUNTER — Other Ambulatory Visit: Payer: Self-pay

## 2023-07-10 ENCOUNTER — Ambulatory Visit: Payer: Medicaid Other | Attending: Obstetrics | Admitting: *Deleted

## 2023-07-10 VITALS — BP 108/66 | HR 96

## 2023-07-10 DIAGNOSIS — O24415 Gestational diabetes mellitus in pregnancy, controlled by oral hypoglycemic drugs: Secondary | ICD-10-CM

## 2023-07-10 DIAGNOSIS — Z3A37 37 weeks gestation of pregnancy: Secondary | ICD-10-CM | POA: Insufficient documentation

## 2023-07-10 DIAGNOSIS — O24419 Gestational diabetes mellitus in pregnancy, unspecified control: Secondary | ICD-10-CM | POA: Insufficient documentation

## 2023-07-10 DIAGNOSIS — O09523 Supervision of elderly multigravida, third trimester: Secondary | ICD-10-CM

## 2023-07-10 DIAGNOSIS — O0993 Supervision of high risk pregnancy, unspecified, third trimester: Secondary | ICD-10-CM

## 2023-07-10 NOTE — Procedures (Signed)
Erin Avery [birth year] [redacted]w[redacted]d  Fetus A Non-Stress Test Interpretation for 07/10/23  Indication: Gestational Diabetes medication controlled  Fetal Heart Rate A Mode: External Baseline Rate (A): 135 bpm Variability: Moderate Accelerations: 15 x 15 Decelerations: None Multiple birth?: No  Uterine Activity Mode: Toco Contraction Frequency (min): One UC Contraction Duration (sec): 120 Contraction Quality: Mild Resting Tone Palpated: Relaxed Resting Time: Adequate  Interpretation (Fetal Testing) Nonstress Test Interpretation: Reactive Comments: Tracing reviewed by Dr. Parke Poisson

## 2023-07-10 NOTE — Procedures (Deleted)
Erin Avery 04-01-1986 [redacted]w[redacted]d  Fetus A Non-Stress Test Interpretation for 07/10/23  Indication: {MFM NST INDICATIONS:20869}  Fetal Heart Rate A Mode: External Baseline Rate (A): 135 bpm Variability: Moderate Accelerations: 15 x 15 Decelerations: None Multiple birth?: No  Uterine Activity Mode: Toco Contraction Frequency (min): One UC Contraction Duration (sec): 120 Contraction Quality: Mild Resting Tone Palpated: Relaxed Resting Time: Adequate  Interpretation (Fetal Testing) Nonstress Test Interpretation: Reactive Comments: Tracing reviewed by Dr. Parke Poisson

## 2023-07-11 ENCOUNTER — Ambulatory Visit: Payer: Medicaid Other | Admitting: Obstetrics and Gynecology

## 2023-07-11 VITALS — BP 109/77 | HR 105 | Wt 157.7 lb

## 2023-07-11 DIAGNOSIS — O24415 Gestational diabetes mellitus in pregnancy, controlled by oral hypoglycemic drugs: Secondary | ICD-10-CM

## 2023-07-11 DIAGNOSIS — O34219 Maternal care for unspecified type scar from previous cesarean delivery: Secondary | ICD-10-CM

## 2023-07-11 DIAGNOSIS — O09523 Supervision of elderly multigravida, third trimester: Secondary | ICD-10-CM

## 2023-07-11 DIAGNOSIS — Z98891 History of uterine scar from previous surgery: Secondary | ICD-10-CM

## 2023-07-11 DIAGNOSIS — Z3A37 37 weeks gestation of pregnancy: Secondary | ICD-10-CM

## 2023-07-11 NOTE — Patient Instructions (Signed)
Erin Avery  07/11/2023   Your procedure is scheduled on:  07/24/2023  Arrive at 0745 at Entrance C on [address] at [facility]  and [address]. You are invited to use the FREE valet parking or use the Visitor's parking deck.  Pick up the phone at the desk and dial [phone].  Call this number if you have problems the morning of surgery: [phone]  Remember:   Do not eat food:(After Midnight) Desps de medianoche.  You may drink clear liquids until arrival at ___0745__.  Clear liquids means a liquid you can see thru.  It can have color such as Cola or Kool aid.  Tea is OK and coffee as long as no milk or creamer of any kind.  Take these medicines the morning of surgery with A SIP OF WATER:  none   Do not wear jewelry, make-up or nail polish.  Do not wear lotions, powders, or perfumes. Do not wear deodorant.  Do not shave 48 hours prior to surgery.  Do not bring valuables to the hospital.  [facility] is not   responsible for any belongings or valuables brought to the hospital.  Contacts, dentures or bridgework may not be worn into surgery.  Leave suitcase in the car. After surgery it may be brought to your room.  For patients admitted to the hospital, checkout time is 11:00 AM the day of              discharge.      Please read over the following fact sheets that you were given:     Preparing for Surgery

## 2023-07-11 NOTE — Progress Notes (Signed)
    PRENATAL VISIT NOTE  Subjective:  Erin Avery is a 37 y.o. [id number] at [redacted]w[redacted]d being seen today for ongoing prenatal care.  She is currently monitored for the following issues for this high-risk pregnancy and has Supervision of high-risk pregnancy; History of cesarean delivery; AMA (advanced maternal age) multigravida 35+; Obesity affecting pregnancy; Rash; and Gestational diabetes on their problem list.  Patient reports occasional contractions.  Contractions: Not present. Vag. Bleeding: None.  Movement: Present. Denies leaking of fluid.   The following portions of the patient's history were reviewed and updated as appropriate: allergies, current medications, past family history, past medical history, past social history, past surgical history and problem list.   Objective:   Vitals:   07/11/23 1425  BP: 109/77  Pulse: (!) 105  Weight: 157 lb 11.2 oz (71.5 kg)    Fetal Status: Fetal Heart Rate (bpm): 156   Movement: Present     General:  Alert, oriented and cooperative. Patient is in no acute distress.  Skin: Skin is warm and dry. No rash noted.   Cardiovascular: Normal heart rate noted  Respiratory: Normal respiratory effort, no problems with respiration noted  Abdomen: Soft, gravid, appropriate for gestational age.  Pain/Pressure: Present     Pelvic: Cervical exam deferred        Extremities: Normal range of motion.  Edema: None  Mental Status: Normal mood and affect. Normal behavior. Normal judgment and thought content.   Assessment and Plan:  Pregnancy: G4P1021 at [redacted]w[redacted]d 1. [redacted] weeks gestation of pregnancy GBS neg. Likely nexplanon  2. Gestational diabetes mellitus (GDM) in third trimester controlled on oral hypoglycemic drug Confirms on metformin 500 bid. Normal CBG log. Continue with weekly testing 11/25: rNST 11/18: 75%, 3190gm, ac 93%, afi 7.3, 8/8, cephalic  3. History of cesarean delivery Desires rpt b/c had bad labor experience prior. For rpt on 12/9  4.  Multigravida of advanced maternal age in third trimester No issues  Term labor symptoms and general obstetric precautions including but not limited to vaginal bleeding, contractions, leaking of fluid and fetal movement were reviewed in detail with the patient. Please refer to After Visit Summary for other counseling recommendations.   No follow-ups on file.  Future Appointments  Date Time Provider Department Center  07/17/2023  8:30 AM [facility] NURSE [facility]  07/17/2023  8:45 AM [facility] NST [facility]  07/21/2023  9:30 AM [facility] PAT 1 [facility] None    Wilson Bing, MD

## 2023-07-12 ENCOUNTER — Encounter (HOSPITAL_COMMUNITY): Payer: Self-pay

## 2023-07-12 ENCOUNTER — Other Ambulatory Visit: Payer: Self-pay | Admitting: Family Medicine

## 2023-07-12 ENCOUNTER — Inpatient Hospital Stay (HOSPITAL_COMMUNITY)
Admission: AD | Admit: 2023-07-12 | Discharge: 2023-07-15 | DRG: 768 | Disposition: A | Discharge status: Home / Self Care | Payer: Medicaid Other | Attending: Obstetrics & Gynecology | Admitting: Obstetrics & Gynecology

## 2023-07-12 ENCOUNTER — Telehealth (HOSPITAL_COMMUNITY): Payer: Self-pay | Admitting: *Deleted

## 2023-07-12 ENCOUNTER — Inpatient Hospital Stay (HOSPITAL_COMMUNITY)
Admission: AD | Admit: 2023-07-12 | Discharge: 2023-07-12 | Disposition: A | Discharge status: Home / Self Care | Payer: Medicaid Other | Source: Home / Self Care | Attending: Obstetrics & Gynecology | Admitting: Obstetrics & Gynecology

## 2023-07-12 ENCOUNTER — Encounter (HOSPITAL_COMMUNITY): Payer: Self-pay | Admitting: Obstetrics & Gynecology

## 2023-07-12 ENCOUNTER — Inpatient Hospital Stay (HOSPITAL_COMMUNITY): Payer: Medicaid Other | Admitting: Anesthesiology

## 2023-07-12 ENCOUNTER — Other Ambulatory Visit: Payer: Self-pay

## 2023-07-12 DIAGNOSIS — O0993 Supervision of high risk pregnancy, unspecified, third trimester: Secondary | ICD-10-CM | POA: Diagnosis not present

## 2023-07-12 DIAGNOSIS — O411230 Chorioamnionitis, third trimester, not applicable or unspecified: Secondary | ICD-10-CM | POA: Diagnosis present

## 2023-07-12 DIAGNOSIS — O26893 Other specified pregnancy related conditions, third trimester: Secondary | ICD-10-CM | POA: Diagnosis not present

## 2023-07-12 DIAGNOSIS — O34211 Maternal care for low transverse scar from previous cesarean delivery: Secondary | ICD-10-CM | POA: Diagnosis not present

## 2023-07-12 DIAGNOSIS — O34219 Maternal care for unspecified type scar from previous cesarean delivery: Secondary | ICD-10-CM | POA: Diagnosis not present

## 2023-07-12 DIAGNOSIS — O24425 Gestational diabetes mellitus in childbirth, controlled by oral hypoglycemic drugs: Secondary | ICD-10-CM | POA: Diagnosis not present

## 2023-07-12 DIAGNOSIS — Z7982 Long term (current) use of aspirin: Secondary | ICD-10-CM

## 2023-07-12 DIAGNOSIS — Z833 Family history of diabetes mellitus: Secondary | ICD-10-CM

## 2023-07-12 DIAGNOSIS — Z3A37 37 weeks gestation of pregnancy: Secondary | ICD-10-CM

## 2023-07-12 DIAGNOSIS — Z825 Family history of asthma and other chronic lower respiratory diseases: Secondary | ICD-10-CM

## 2023-07-12 DIAGNOSIS — O24419 Gestational diabetes mellitus in pregnancy, unspecified control: Secondary | ICD-10-CM | POA: Diagnosis present

## 2023-07-12 DIAGNOSIS — O09523 Supervision of elderly multigravida, third trimester: Secondary | ICD-10-CM

## 2023-07-12 DIAGNOSIS — K219 Gastro-esophageal reflux disease without esophagitis: Secondary | ICD-10-CM | POA: Diagnosis present

## 2023-07-12 DIAGNOSIS — Z3A38 38 weeks gestation of pregnancy: Secondary | ICD-10-CM | POA: Diagnosis not present

## 2023-07-12 DIAGNOSIS — Z8759 Personal history of other complications of pregnancy, childbirth and the puerperium: Secondary | ICD-10-CM | POA: Diagnosis not present

## 2023-07-12 DIAGNOSIS — O479 False labor, unspecified: Secondary | ICD-10-CM | POA: Diagnosis not present

## 2023-07-12 DIAGNOSIS — O9962 Diseases of the digestive system complicating childbirth: Secondary | ICD-10-CM | POA: Diagnosis present

## 2023-07-12 DIAGNOSIS — Z8249 Family history of ischemic heart disease and other diseases of the circulatory system: Secondary | ICD-10-CM

## 2023-07-12 DIAGNOSIS — Z98891 History of uterine scar from previous surgery: Secondary | ICD-10-CM

## 2023-07-12 DIAGNOSIS — Z8632 Personal history of gestational diabetes: Secondary | ICD-10-CM | POA: Diagnosis present

## 2023-07-12 DIAGNOSIS — D62 Acute posthemorrhagic anemia: Secondary | ICD-10-CM | POA: Diagnosis not present

## 2023-07-12 DIAGNOSIS — O24424 Gestational diabetes mellitus in childbirth, insulin controlled: Secondary | ICD-10-CM | POA: Diagnosis not present

## 2023-07-12 DIAGNOSIS — O411290 Chorioamnionitis, unspecified trimester, not applicable or unspecified: Secondary | ICD-10-CM | POA: Diagnosis not present

## 2023-07-12 DIAGNOSIS — O471 False labor at or after 37 completed weeks of gestation: Secondary | ICD-10-CM | POA: Insufficient documentation

## 2023-07-12 DIAGNOSIS — O4202 Full-term premature rupture of membranes, onset of labor within 24 hours of rupture: Secondary | ICD-10-CM | POA: Diagnosis not present

## 2023-07-12 LAB — CBC
HCT: 34 % — ABNORMAL LOW (ref 36.0–46.0)
Hemoglobin: 10.8 g/dL — ABNORMAL LOW (ref 12.0–15.0)
MCH: 26.1 pg (ref 26.0–34.0)
MCHC: 31.8 g/dL (ref 30.0–36.0)
MCV: 82.1 fL (ref 80.0–100.0)
Platelets: 196 10*3/uL (ref 150–400)
RBC: 4.14 MIL/uL (ref 3.87–5.11)
RDW: 14.3 % (ref 11.5–15.5)
WBC: 9.6 10*3/uL (ref 4.0–10.5)
nRBC: 0 % (ref 0.0–0.2)

## 2023-07-12 LAB — URINALYSIS, ROUTINE W REFLEX MICROSCOPIC
Bilirubin Urine: NEGATIVE
Glucose, UA: NEGATIVE mg/dL
Hgb urine dipstick: NEGATIVE
Ketones, ur: NEGATIVE mg/dL
Leukocytes,Ua: NEGATIVE
Nitrite: NEGATIVE
Protein, ur: NEGATIVE mg/dL
Specific Gravity, Urine: 1.008 (ref 1.005–1.030)
pH: 6 (ref 5.0–8.0)

## 2023-07-12 LAB — WET PREP, GENITAL
Clue Cells Wet Prep HPF POC: NONE SEEN
Sperm: NONE SEEN
Trich, Wet Prep: NONE SEEN
WBC, Wet Prep HPF POC: <10 (ref <10)
Yeast Wet Prep HPF POC: NONE SEEN

## 2023-07-12 LAB — GLUCOSE, CAPILLARY: Glucose-Capillary: 116 mg/dL — ABNORMAL HIGH (ref 70–99)

## 2023-07-12 MED ORDER — ACETAMINOPHEN 325 MG PO TABS
650.0000 mg | ORAL_TABLET | ORAL | Status: DC | PRN
  Administered 2023-07-12: 650 mg via ORAL
  Filled 2023-07-12: qty 2

## 2023-07-12 MED ORDER — PHENYLEPHRINE 80 MCG/ML (10ML) SYRINGE FOR IV PUSH (FOR BLOOD PRESSURE SUPPORT): 80.0000 ug | PREFILLED_SYRINGE | INTRAVENOUS | Status: DC | PRN

## 2023-07-12 MED ORDER — LACTATED RINGERS IV SOLN
500.0000 mL | INTRAVENOUS | Status: DC | PRN
  Administered 2023-07-12 (×2): 500 mL via INTRAVENOUS

## 2023-07-12 MED ORDER — LIDOCAINE-EPINEPHRINE (PF) 1.5 %-1:200000 IJ SOLN
INTRAMUSCULAR | Status: DC | PRN
  Administered 2023-07-12: 2 mL via EPIDURAL
  Administered 2023-07-12: 3 mL via EPIDURAL

## 2023-07-12 MED ORDER — SODIUM CHLORIDE 0.9 % IV SOLN
2.0000 g | Freq: Four times a day (QID) | INTRAVENOUS | Status: DC
  Administered 2023-07-12: 2 g via INTRAVENOUS

## 2023-07-12 MED ORDER — FENTANYL-BUPIVACAINE-NACL 0.5-0.125-0.9 MG/250ML-% EP SOLN
12.0000 mL/h | EPIDURAL | Status: DC | PRN
  Administered 2023-07-12: 12 mL/h via EPIDURAL

## 2023-07-12 MED ORDER — FENTANYL-BUPIVACAINE-NACL 0.5-0.125-0.9 MG/250ML-% EP SOLN
EPIDURAL | Status: AC
  Filled 2023-07-12: qty 250

## 2023-07-12 MED ORDER — GENTAMICIN SULFATE 40 MG/ML IJ SOLN
5.0000 mg/kg | INTRAVENOUS | Status: DC
  Administered 2023-07-13: 260 mg via INTRAVENOUS
  Filled 2023-07-12: qty 6.5

## 2023-07-12 MED ORDER — EPHEDRINE 5 MG/ML INJ
10.0000 mg | INTRAVENOUS | Status: DC | PRN
Start: 2023-07-12 — End: 2023-07-13

## 2023-07-12 MED ORDER — OXYCODONE-ACETAMINOPHEN 5-325 MG PO TABS: 1.0000 | ORAL_TABLET | ORAL | Status: DC | PRN

## 2023-07-12 MED ORDER — LACTATED RINGERS IV SOLN
500.0000 mL | Freq: Once | INTRAVENOUS | Status: AC
  Administered 2023-07-12: 500 mL via INTRAVENOUS

## 2023-07-12 MED ORDER — EPHEDRINE 5 MG/ML INJ: 10.0000 mg | INTRAVENOUS | Status: DC | PRN

## 2023-07-12 MED ORDER — LIDOCAINE HCL (PF) 1 % IJ SOLN: 30.0000 mL | INTRAMUSCULAR | Status: DC | PRN

## 2023-07-12 MED ORDER — SOD CITRATE-CITRIC ACID 500-334 MG/5ML PO SOLN: 30.0000 mL | ORAL | Status: DC | PRN

## 2023-07-12 MED ORDER — DIPHENHYDRAMINE HCL 50 MG/ML IJ SOLN: 12.5000 mg | INTRAMUSCULAR | Status: DC | PRN

## 2023-07-12 MED ORDER — LACTATED RINGERS IV SOLN: INTRAVENOUS | Status: DC

## 2023-07-12 MED ORDER — FENTANYL CITRATE (PF) 100 MCG/2ML IJ SOLN
100.0000 ug | Freq: Once | INTRAMUSCULAR | Status: AC
  Administered 2023-07-12: 100 ug via INTRAVENOUS
  Filled 2023-07-12: qty 2

## 2023-07-12 MED ORDER — OXYTOCIN BOLUS FROM INFUSION
333.0000 mL | Freq: Once | INTRAVENOUS | Status: AC
  Administered 2023-07-13: 333 mL via INTRAVENOUS

## 2023-07-12 MED ORDER — OXYCODONE-ACETAMINOPHEN 5-325 MG PO TABS: 2.0000 | ORAL_TABLET | ORAL | Status: DC | PRN

## 2023-07-12 MED ORDER — OXYTOCIN-SODIUM CHLORIDE 30-0.9 UT/500ML-% IV SOLN
2.5000 [IU]/h | INTRAVENOUS | Status: DC
  Administered 2023-07-13: 2.5 [IU]/h via INTRAVENOUS
  Filled 2023-07-12: qty 500

## 2023-07-12 MED ORDER — ONDANSETRON HCL 4 MG/2ML IJ SOLN
4.0000 mg | Freq: Four times a day (QID) | INTRAMUSCULAR | Status: DC | PRN
  Administered 2023-07-12: 4 mg via INTRAVENOUS
  Filled 2023-07-12: qty 2

## 2023-07-12 NOTE — MAU Note (Signed)
..  Erin Avery is a 37 y.o. at [redacted]w[redacted]d here in MAU reporting: Srom at 1645, clear fluid. Also reports ctx back to back. Denies VB. +FM. Think she may want to [name].   Pain score: 10  Lab orders placed from triage:   fern

## 2023-07-12 NOTE — Anesthesia Procedure Notes (Signed)
Epidural Patient location during procedure: OB Start time: 07/12/2023 8:00 PM End time: 07/12/2023 8:14 PM  Staffing Anesthesiologist: Lewie Loron, MD Performed: anesthesiologist   Preanesthetic Checklist Completed: patient identified, IV checked, risks and benefits discussed, monitors and equipment checked, pre-op evaluation and timeout performed  Epidural Patient position: sitting Prep: DuraPrep and site prepped and draped Patient monitoring: heart rate, continuous pulse ox and blood pressure Approach: midline Location: L3-L4 Injection technique: LOR air and LOR saline  Needle:  Needle type: Tuohy  Needle gauge: 17 G Needle length: 9 cm Needle insertion depth: 5 cm Catheter type: closed end flexible Catheter size: 19 Gauge Catheter at skin depth: 11 cm Test dose: negative and 1.5% lidocaine with Epi 1:200 K  Assessment Sensory level: T8 Events: blood not aspirated, no cerebrospinal fluid, injection not painful, no injection resistance, no paresthesia and negative IV test  Additional Notes Reason for block:procedure for pain

## 2023-07-12 NOTE — H&P (Signed)
OBSTETRIC ADMISSION HISTORY AND PHYSICAL  Erin Avery is a 37 y.o. female [phone] with IUP at [redacted]w[redacted]d by LMP presenting for SOL w/ SROM. She reports +FMs, No LOF, no VB, no blurry vision, headaches or peripheral edema, and RUQ pain.  She plans on breast and formula feeding. She request post placental IUD for birth control. She received her prenatal care at  [facility]    Dating: By LMP --->  Estimated Date of Delivery: 07/27/23  Sono:    @[redacted]w[redacted]d , CWD, normal anatomy, cephalic presentation,  anterior, 3190 g, 75 % EFW   Prenatal History/Complications: GDM  Past Medical History: Past Medical History:  Diagnosis Date   Complication of anesthesia    had severe back pain with last epidural   GERD (gastroesophageal reflux disease)    Gestational diabetes    Missed abortion 11/15/2021    Past Surgical History: Past Surgical History:  Procedure Laterality Date   CESAREAN SECTION     DILATION AND EVACUATION N/A 11/24/2021   Procedure: DILATATION AND EVACUATION;  Surgeon: Warden Fillers, MD;  Location: [facility] OR;  Service: Gynecology;  Laterality: N/A;    Obstetrical History: OB History     Gravida  4   Para  1   Term  1   Preterm      AB  2   Living  1      SAB  2   IAB  0   Ectopic      Multiple      Live Births  1           Social History Social History   Socioeconomic History   Marital status: Married    Spouse name: Not on file   Number of children: Not on file   Years of education: Not on file   Highest education level: Not on file  Occupational History   Occupation: aesthetician  Tobacco Use   Smoking status: Never    Passive exposure: Never   Smokeless tobacco: Never  Vaping Use   Vaping status: Never Used  Substance and Sexual Activity   Alcohol use: Never   Drug use: Never   Sexual activity: Yes    Birth control/protection: None  Other Topics Concern   Not on file  Social History Narrative   Not on file   Social Determinants of Health    Financial Resource Strain: Not on file  Food Insecurity: No Food Insecurity (02/13/2023)   Hunger Vital Sign    Worried About Running Out of Food in the Last Year: Never true    Ran Out of Food in the Last Year: Never true  Transportation Needs: No Transportation Needs (02/13/2023)   PRAPARE - Administrator, Civil Service (Medical): No    Lack of Transportation (Non-Medical): No  Physical Activity: Not on file  Stress: Not on file  Social Connections: Not on file    Family History: Family History  Problem Relation Age of Onset   Diabetes Mother    Hypertension Mother    Asthma Father     Allergies: No Known Allergies  Medications Prior to Admission  Medication Sig Dispense Refill Last Dose   Accu-Chek Softclix Lancets lancets Use as instructed 100 each 12    aspirin EC 81 MG tablet Take 1 tablet (81 mg total) by mouth daily. 90 tablet 3    Blood Glucose Monitoring Suppl (ACCU-CHEK GUIDE) w/Device KIT 1 each by Does not apply route 3 (three) times daily. 1 kit  0    Continuous Glucose Sensor (FREESTYLE LIBRE 3 SENSOR) MISC 1 each by Does not apply route every 14 (fourteen) days. Place 1 sensor on the skin every 14 days. Use to check glucose continuously 2 each 11    glucose blood (ACCU-CHEK SMARTVIEW) test strip Use as instructed 100 each 12    hydrocortisone 2.5 % lotion Apply topically 2 (two) times daily. 118 mL 0    hydrOXYzine (VISTARIL) 25 MG capsule Take 1 capsule (25 mg total) by mouth every 6 (six) hours as needed for itching. 30 capsule 0    metFORMIN (GLUCOPHAGE-XR) 500 MG 24 hr tablet Take 1 tablet by mouth 2 (two) times daily with a meal.      Prenatal Vit-Fe Fumarate-FA (MULTIVITAMIN-PRENATAL) 27-0.8 MG TABS tablet Take 1 tablet by mouth daily at 12 noon.      triamcinolone ointment (KENALOG) 0.1 % Apply 1 Application topically daily. USE FOR 2 WEEKS THEN STOP (Patient not taking: Reported on 07/05/2023) 60 g 3      Review of Systems   All systems  reviewed and negative except as stated in HPI  Blood pressure 130/72, pulse (!) 122, temperature 98.3 F (36.8 C), temperature source Oral, resp. rate 14, last menstrual period 10/14/2022, SpO2 98%. General appearance: alert, cooperative, and moderate distress Lungs: clear to auscultation bilaterally Heart: regular rate and rhythm Abdomen: soft, non-tender; bowel sounds normal Pelvic: Extremities: Homans sign is negative, no sign of DVT Presentation: cephalic Fetal monitoringBaseline: 150 bpm, Variability: Good {> 6 bpm), Accelerations: Reactive, and Decelerations: Absent Uterine activityFrequency: Every 5 minutes Dilation: 3.5 Effacement (%): 80 Station: -2 Exam by:: Jeanice Lim Flippin RN   Prenatal labs: ABO, Rh: AB/Positive/-- (04/26 1201) Antibody: Negative (04/26 1201) Rubella: 6.14 (04/26 1201) RPR: Non Reactive (04/26 1201)  HBsAg: Negative (04/26 1201)  HIV: Non Reactive (09/25 0952)  GBS: Negative/-- (11/20 0445)  1 hr Glucola abnormal Genetic screening  Normal  Anatomy US Normal   Prenatal Transfer Tool  Maternal Diabetes: Yes:  Diabetes Type:  Insulin/Medication controlled Genetic Screening: Normal Maternal Ultrasounds/Referrals: Normal Fetal Ultrasounds or other Referrals:  None Maternal Substance Abuse:  No Significant Maternal Medications:  None Significant Maternal Lab Results:  Group B Strep negative Number of Prenatal Visits:greater than 3 verified prenatal visits Other Comments:  None  Results for orders placed or performed during the hospital encounter of 07/12/23 (from the past 24 hour(s))  Wet prep, genital   Collection Time: 07/12/23  9:24 AM   Specimen: Vaginal  Result Value Ref Range   Yeast Wet Prep HPF POC NONE SEEN NONE SEEN   Trich, Wet Prep NONE SEEN NONE SEEN   Clue Cells Wet Prep HPF POC NONE SEEN NONE SEEN   WBC, Wet Prep HPF POC <10 <10   Sperm NONE SEEN   Urinalysis, Routine w reflex microscopic -Urine, Clean Catch   Collection Time:  07/12/23 10:03 AM  Result Value Ref Range   Color, Urine YELLOW YELLOW   APPearance CLEAR CLEAR   Specific Gravity, Urine 1.008 1.005 - 1.030   pH 6.0 5.0 - 8.0   Glucose, UA NEGATIVE NEGATIVE mg/dL   Hgb urine dipstick NEGATIVE NEGATIVE   Bilirubin Urine NEGATIVE NEGATIVE   Ketones, ur NEGATIVE NEGATIVE mg/dL   Protein, ur NEGATIVE NEGATIVE mg/dL   Nitrite NEGATIVE NEGATIVE   Leukocytes,Ua NEGATIVE NEGATIVE    Patient Active Problem List   Diagnosis Date Noted   Gestational diabetes 05/18/2023   Rash 04/10/2023   Obesity affecting pregnancy 02/27/2023  AMA (advanced maternal age) multigravida 35+ 02/13/2023   Supervision of high-risk pregnancy 12/09/2022   History of cesarean delivery 12/09/2022    Assessment/Plan:  Erin Avery is a 37 y.o. G4P1021 at [redacted]w[redacted]d here forSOL w/ SROM   #Labor:Progressing spontaneously, discussed repeat C-section versus TOLAC.  Given patient has 3-1/2 cm and is in spontaneous labor she would like a trial of labor after C-section.  Discussed the risks and benefits of a trial of labor after C-section and went through consent form with the patient.  Patient agreeable for trial of labor and signed paperwork with no further questions or concerns.  Will admit to labor and continue monitoring.  Pt. Is a [id number] with # 1 prior LTCS for failed vacuum delivery.  Pt. Counseled about risk of TOLAC including uterine rupture, fetal death.  Pt. Counseled about future implications, chance of success with non-recurring reason for Primary CS.  Risks of RCS also reviewed including bleeding, scarring, abnormal placentation, infection, longer recovery.  After careful consideration, pt. Elects TOLAC.  Consent signed and scanned to chart.  #Pain: Patient requesting epidural #FWB: Category 1 #ID:  GBS negative #MOF: Breast and formula #MOC: Post placental IUD, discussed the risk and benefits of postal single IUD.   Celedonio Savage, MD  07/12/2023, 6:35 PM

## 2023-07-12 NOTE — Progress Notes (Signed)
Labor Progress Note  Erin Avery is a 37 y.o. [id number] at [redacted]w[redacted]d presented for SOL w/ SROM.   S: called to room, patient feeling lots of pressure.   O:  BP (!) 95/45   Pulse 88   Temp 98.2 F (36.8 C) (Oral)   Resp 16   LMP 10/14/2022   SpO2 99%   Breastfeeding Unknown  EFM:150bpm/Moderate variability/ 15x15 accels/ None decels CAT: 1 Toco: regular, every 5 minutes   CVE: Dilation: 10 Dilation Complete Date: 07/12/23 Dilation Complete Time: 2025 Effacement (%): 100 Station: 0 Presentation: Vertex Exam by:: Dr. Judd Lien   A&P: 37 y.o. [id number] [redacted]w[redacted]d  here for SOL as above  #Labor: Progressing well. Found to be complete, practice pushing with small change.  Will allow 1 hour of laboring down before pushing again or sooner if more constant pressure.  #Pain: per patient request #FWB: CAT 1 #GBS negative  Hessie Dibble, MD FMOB Fellow, Faculty practice [facility], Center for [facility] 07/13/23  8:36 AM

## 2023-07-12 NOTE — Anesthesia Preprocedure Evaluation (Signed)
Anesthesia Evaluation  Patient identified by MRN, date of birth, ID band Patient awake    Reviewed: Allergy & Precautions, Patient's Chart, lab work & pertinent test results  Airway Mallampati: II  TM Distance: >3 FB     Dental   Pulmonary neg pulmonary ROS   breath sounds clear to auscultation       Cardiovascular negative cardio ROS  Rhythm:Regular Rate:Normal     Neuro/Psych negative neurological ROS     GI/Hepatic Neg liver ROS,GERD  ,,  Endo/Other  diabetes    Renal/GU negative Renal ROS     Musculoskeletal   Abdominal  (+) + obese  Peds  Hematology   Anesthesia Other Findings   Reproductive/Obstetrics (+) Pregnancy                             Anesthesia Physical Anesthesia Plan  ASA: 3  Anesthesia Plan: Epidural   Post-op Pain Management:    Induction:   PONV Risk Score and Plan:   Airway Management Planned:   Additional Equipment:   Intra-op Plan:   Post-operative Plan:   Informed Consent: I have reviewed the patients History and Physical, chart, labs and discussed the procedure including the risks, benefits and alternatives for the proposed anesthesia with the patient or authorized representative who has indicated his/her understanding and acceptance.       Plan Discussed with:   Anesthesia Plan Comments:         Anesthesia Quick Evaluation

## 2023-07-12 NOTE — MAU Provider Note (Signed)
Event Date/Time   First Provider Initiated Contact with Patient 07/12/23 1024     S: Erin Avery is a 37 y.o. [phone] at [redacted]w[redacted]d  who presents to MAU today complaining contractions q 2-6 minutes since last night. She denies vaginal bleeding. Endorses a single episode of brown discharge on toilet paper while wiping this morning. She denies LOF. She reports normal fetal movement.    O: BP 113/71   Pulse 84   Temp 98 F (36.7 C) (Oral)   Resp 17   Wt 71.3 kg   LMP 10/14/2022   BMI 35.24 kg/m  GENERAL: Well-developed, well-nourished female in no acute distress.  HEAD: Normocephalic, atraumatic.  CHEST: Normal effort of breathing, regular heart rate ABDOMEN: Soft, nontender, gravid  Cervical exam:  Dilation: Fingertip Effacement (%): Thick Cervical Position: Posterior Station: -3 Presentation: Vertex Exam by:: K. Cowher RN   Fetal Monitoring: Baseline: 145 Variability: moderate Accelerations: present Decelerations: absent Contractions: q2-70min; felt as 3/10 back pain  A: SIUP at [redacted]w[redacted]d  False labor  P: Discharge home Continue routine prenatal care as scheduled Labor and return precautions provided  Wyn Forster, MD 07/12/2023 10:26 AM

## 2023-07-12 NOTE — MAU Note (Addendum)
Erin Avery is a 37 y.o. at [redacted]w[redacted]d here in MAU reporting: woke up and has some vaginal spotting that was dark brown when she wiped. Has had discharge the last couple of days. Denies any recent sexual intercourse. Denies any LOF. Reports lower back pain that comes and goes and is usually worse in the mornings.  Reports +FM  LMP: n/a  Onset of complaint: today  Pain score: 5 back  Vitals:   07/12/23 0913  BP: 117/76  Pulse: 90  Resp: 17  Temp: 98 F (36.7 C)     FHT:140 Lab orders placed from triage:

## 2023-07-12 NOTE — Telephone Encounter (Signed)
Preadmission screen  

## 2023-07-13 ENCOUNTER — Encounter (HOSPITAL_COMMUNITY): Payer: Self-pay | Admitting: Obstetrics and Gynecology

## 2023-07-13 DIAGNOSIS — O24424 Gestational diabetes mellitus in childbirth, insulin controlled: Secondary | ICD-10-CM | POA: Diagnosis not present

## 2023-07-13 DIAGNOSIS — Z8759 Personal history of other complications of pregnancy, childbirth and the puerperium: Secondary | ICD-10-CM

## 2023-07-13 DIAGNOSIS — O4202 Full-term premature rupture of membranes, onset of labor within 24 hours of rupture: Secondary | ICD-10-CM | POA: Diagnosis not present

## 2023-07-13 DIAGNOSIS — O34211 Maternal care for low transverse scar from previous cesarean delivery: Secondary | ICD-10-CM | POA: Diagnosis not present

## 2023-07-13 DIAGNOSIS — O411290 Chorioamnionitis, unspecified trimester, not applicable or unspecified: Secondary | ICD-10-CM | POA: Insufficient documentation

## 2023-07-13 DIAGNOSIS — Z3A38 38 weeks gestation of pregnancy: Secondary | ICD-10-CM | POA: Diagnosis not present

## 2023-07-13 DIAGNOSIS — Z98891 History of uterine scar from previous surgery: Secondary | ICD-10-CM

## 2023-07-13 DIAGNOSIS — O411230 Chorioamnionitis, third trimester, not applicable or unspecified: Secondary | ICD-10-CM | POA: Diagnosis not present

## 2023-07-13 DIAGNOSIS — Z3A00 Weeks of gestation of pregnancy not specified: Secondary | ICD-10-CM | POA: Diagnosis not present

## 2023-07-13 DIAGNOSIS — O34219 Maternal care for unspecified type scar from previous cesarean delivery: Secondary | ICD-10-CM | POA: Diagnosis not present

## 2023-07-13 HISTORY — DX: Personal history of other complications of pregnancy, childbirth and the puerperium: Z87.59

## 2023-07-13 LAB — CBC
HCT: 28 % — ABNORMAL LOW (ref 36.0–46.0)
Hemoglobin: 9 g/dL — ABNORMAL LOW (ref 12.0–15.0)
MCH: 26.3 pg (ref 26.0–34.0)
MCHC: 32.1 g/dL (ref 30.0–36.0)
MCV: 81.9 fL (ref 80.0–100.0)
Platelets: 175 10*3/uL (ref 150–400)
RBC: 3.42 MIL/uL — ABNORMAL LOW (ref 3.87–5.11)
RDW: 14.4 % (ref 11.5–15.5)
WBC: 17.3 10*3/uL — ABNORMAL HIGH (ref 4.0–10.5)
nRBC: 0 % (ref 0.0–0.2)

## 2023-07-13 LAB — TYPE AND SCREEN
ABO/RH(D): AB POS
Antibody Screen: NEGATIVE

## 2023-07-13 LAB — RPR: RPR Ser Ql: NONREACTIVE

## 2023-07-13 MED ORDER — DIPHENHYDRAMINE HCL 25 MG PO CAPS
25.0000 mg | ORAL_CAPSULE | Freq: Four times a day (QID) | ORAL | Status: DC | PRN
Start: 2023-07-13 — End: 2023-07-15

## 2023-07-13 MED ORDER — TETANUS-DIPHTH-ACELL PERTUSSIS 5-2.5-18.5 LF-MCG/0.5 IM SUSY: 0.5000 mL | PREFILLED_SYRINGE | Freq: Once | INTRAMUSCULAR | Status: DC

## 2023-07-13 MED ORDER — IBUPROFEN 600 MG PO TABS
600.0000 mg | ORAL_TABLET | Freq: Four times a day (QID) | ORAL | Status: DC
  Administered 2023-07-13 – 2023-07-15 (×10): 600 mg via ORAL
  Filled 2023-07-13 (×10): qty 1

## 2023-07-13 MED ORDER — SIMETHICONE 80 MG PO CHEW
80.0000 mg | CHEWABLE_TABLET | ORAL | Status: DC | PRN
Start: 2023-07-13 — End: 2023-07-15

## 2023-07-13 MED ORDER — FERROUS SULFATE 325 (65 FE) MG PO TABS
325.0000 mg | ORAL_TABLET | ORAL | Status: DC
  Administered 2023-07-15: 325 mg via ORAL
  Filled 2023-07-13: qty 1

## 2023-07-13 MED ORDER — ONDANSETRON HCL 4 MG/2ML IJ SOLN
4.0000 mg | INTRAMUSCULAR | Status: DC | PRN
Start: 2023-07-13 — End: 2023-07-15

## 2023-07-13 MED ORDER — SENNOSIDES-DOCUSATE SODIUM 8.6-50 MG PO TABS
2.0000 | ORAL_TABLET | ORAL | Status: DC
  Administered 2023-07-13 – 2023-07-14 (×2): 2 via ORAL
  Filled 2023-07-13 (×2): qty 2

## 2023-07-13 MED ORDER — DIBUCAINE (PERIANAL) 1 % EX OINT
1.0000 | TOPICAL_OINTMENT | CUTANEOUS | Status: DC | PRN
  Administered 2023-07-15: 1 via RECTAL
  Filled 2023-07-13: qty 28

## 2023-07-13 MED ORDER — WITCH HAZEL-GLYCERIN EX PADS
1.0000 | MEDICATED_PAD | CUTANEOUS | Status: DC | PRN
  Administered 2023-07-15: 1 via TOPICAL

## 2023-07-13 MED ORDER — COCONUT OIL OIL: 1.0000 | TOPICAL_OIL | Status: DC | PRN

## 2023-07-13 MED ORDER — BENZOCAINE-MENTHOL 20-0.5 % EX AERO
1.0000 | INHALATION_SPRAY | CUTANEOUS | Status: DC | PRN
Start: 2023-07-13 — End: 2023-07-15
  Administered 2023-07-13 – 2023-07-14 (×2): 1 via TOPICAL
  Filled 2023-07-13 (×2): qty 56

## 2023-07-13 MED ORDER — OXYCODONE HCL 5 MG PO TABS
5.0000 mg | ORAL_TABLET | Freq: Four times a day (QID) | ORAL | Status: DC | PRN
  Administered 2023-07-13: 5 mg via ORAL
  Administered 2023-07-13: 10 mg via ORAL
  Filled 2023-07-13: qty 2
  Filled 2023-07-13: qty 1

## 2023-07-13 MED ORDER — ACETAMINOPHEN 325 MG PO TABS
650.0000 mg | ORAL_TABLET | ORAL | Status: DC | PRN
Start: 2023-07-13 — End: 2023-07-15
  Administered 2023-07-13 – 2023-07-14 (×2): 650 mg via ORAL
  Filled 2023-07-13 (×2): qty 2

## 2023-07-13 MED ORDER — FERROUS SULFATE 325 (65 FE) MG PO TABS
325.0000 mg | ORAL_TABLET | Freq: Two times a day (BID) | ORAL | Status: DC
Start: 2023-07-13 — End: 2023-07-13
  Administered 2023-07-13: 325 mg via ORAL
  Filled 2023-07-13: qty 1

## 2023-07-13 MED ORDER — ZOLPIDEM TARTRATE 5 MG PO TABS
5.0000 mg | ORAL_TABLET | Freq: Every evening | ORAL | Status: DC | PRN
Start: 2023-07-13 — End: 2023-07-15

## 2023-07-13 MED ORDER — PRENATAL MULTIVITAMIN CH
1.0000 | ORAL_TABLET | Freq: Every day | ORAL | Status: DC
  Administered 2023-07-13 – 2023-07-15 (×3): 1 via ORAL
  Filled 2023-07-13 (×3): qty 1

## 2023-07-13 MED ORDER — LACTATED RINGERS IV BOLUS
1000.0000 mL | Freq: Once | INTRAVENOUS | Status: AC
  Administered 2023-07-13: 1000 mL via INTRAVENOUS

## 2023-07-13 MED ORDER — ONDANSETRON HCL 4 MG PO TABS
4.0000 mg | ORAL_TABLET | ORAL | Status: DC | PRN
Start: 2023-07-13 — End: 2023-07-15

## 2023-07-13 NOTE — Discharge Summary (Signed)
Postpartum Discharge Summary      Patient Name: Erin Avery DOB: December 12, 1985 MRN: [id number]  Date of admission: 07/12/2023 Delivery date:07/13/2023 Delivering provider: Adam Phenix Date of discharge: 07/15/2023  Admitting diagnosis: Indication for care in labor or delivery [O75.9] H/O: C-section [Z98.891] Intrauterine pregnancy: [redacted]w[redacted]d     Secondary diagnosis:  Principal Problem:   Indication for care in labor or delivery Active Problems:   Gestational diabetes   Vaginal birth after cesarean   Third degree laceration of perineum, type 3c   Chorioamnionitis   H/O: C-section   Acute blood loss anemia  Additional problems: Chorioamnionitis, 3C perineal laceration    Discharge diagnosis: Term Pregnancy Delivered, VBAC, and GDM A2                                              Post partum procedures: none Augmentation: Pitocin Complications: None  Hospital course: Onset of Labor With Vaginal Delivery      37 y.o. yo [id number] at 102w0d was admitted in Latent Labor on 07/12/2023. Labor course was complicated by H/O C/S and GDM. She also developed chorioamnionitis prior to delivery and sustained a 3C perineal laceration requiring repair.   Membrane Rupture Time/Date: 4:45 PM,07/12/2023  Delivery Method:Vaginal, Spontaneous Operative Delivery:N/A Episiotomy: None Lacerations:  3rd degree Patient had an uncomplicated postpartum course. Her fasting CBG on PPD#1 was 96. She had a Hgb of 7.6 on PPD#1 (EBL was 357cc w a 3c lac), but had equilibrated to 8.1 on PPD#2 and she was asymptomatic. She is ambulating, tolerating a regular diet, passing flatus, and urinating well. Patient is discharged home in stable condition on 07/15/23.  Newborn Data: Birth date:07/13/2023 Birth time:1:19 AM Gender:Female Living status:Living Apgars:7 ,9  Weight:3280 g (7lb 3.7oz)  Magnesium Sulfate received: No BMZ received: No Rhophylac:N/A MMR:N/A T-DaP: declined prenatally Flu: Yes RSV  Vaccine received: No Transfusion:No  Immunizations received: Immunization History  Administered Date(s) Administered   Influenza, Seasonal, Injecte, Preservative Fre 07/05/2023   Influenza,inj,Quad PF,6+ Mos 07/17/2020    Physical exam  Vitals:   07/14/23 1325 07/14/23 1558 07/14/23 2125 07/15/23 0514  BP: 109/65  121/77 104/65  Pulse: 90  74 86  Resp: 18  18 18   Temp: 98 F (36.7 C)  98.4 F (36.9 C) 98 F (36.7 C)  TempSrc: Oral  Oral Oral  SpO2: 100%  100% 100%  Weight:  70.8 kg    Height:  4\' 8"  (1.422 m)     General: alert and cooperative Lochia: appropriate Uterine Fundus: firm Incision: N/A DVT Evaluation: No evidence of DVT seen on physical exam. Labs: Lab Results  Component Value Date   WBC 8.3 07/15/2023   HGB 8.1 (L) 07/15/2023   HCT 26.1 (L) 07/15/2023   MCV 84.5 07/15/2023   PLT 189 07/15/2023      Latest Ref Rng & Units 07/14/2023    5:05 AM  CMP  Glucose 70 - 99 mg/dL 96    Edinburgh Score:    07/14/2023    4:33 AM  Edinburgh Postnatal Depression Scale Screening Tool  I have been able to laugh and see the funny side of things. 0  I have looked forward with enjoyment to things. 0  I have blamed myself unnecessarily when things went wrong. 0  I have been anxious or worried for no good reason. 0  I have  felt scared or panicky for no good reason. 0  Things have been getting on top of me. 3  I have been so unhappy that I have had difficulty sleeping. 0  I have felt sad or miserable. 0  I have been so unhappy that I have been crying. 0  The thought of harming myself has occurred to me. 0  Edinburgh Postnatal Depression Scale Total 3   Edinburgh Postnatal Depression Scale Total: 3   After visit meds:  Allergies as of 07/15/2023   No Known Allergies      Medication List     STOP taking these medications    Accu-Chek Guide w/Device Kit   Accu-Chek SmartView test strip Generic drug: glucose blood   Accu-Chek Softclix Lancets  lancets   aspirin EC 81 MG tablet   FreeStyle Libre 3 Sensor Misc   hydrocortisone 2.5 % lotion   hydrOXYzine 25 MG capsule Commonly known as: Vistaril   metFORMIN 500 MG 24 hr tablet Commonly known as: GLUCOPHAGE-XR   triamcinolone ointment 0.1 % Commonly known as: KENALOG       TAKE these medications    Ferrous Fumarate 150 MG Tabs Take 1 tablet (150 mg total) by mouth every other day.   ibuprofen 600 MG tablet Commonly known as: ADVIL Take 1 tablet (600 mg total) by mouth every 6 (six) hours as needed.   multivitamin-prenatal 27-0.8 MG Tabs tablet Take 1 tablet by mouth daily at 12 noon.         Discharge home in stable condition Infant Feeding: Bottle and Breast Infant Disposition:home with mother Discharge instruction: per After Visit Summary and Postpartum booklet. Activity: Advance as tolerated. Pelvic rest for 6 weeks.  Diet: routine diet Future Appointments: Future Appointments  Date Time Provider Department Center  07/17/2023  8:30 AM [facility] NURSE [facility]  07/17/2023  8:45 AM [facility] NST [facility]  07/17/2023  9:55 AM Warden Fillers, MD [facility]  07/21/2023  9:30 AM [facility] PAT 1 [facility] None   Follow up Visit: Message sent to [facility] 11/28  Please schedule this patient for a In person postpartum visit in 6 weeks with the following provider: Any provider. Additional Postpartum F/U:2 hour GTT at  PPV and Laceration Check in 1 week,   High risk pregnancy complicated by: GDM, Chorio, 3C laceration Delivery mode:  Vaginal, Spontaneous Anticipated Birth Control:  Unsure   07/15/2023 Arabella Merles, CNM 6:44 AM

## 2023-07-13 NOTE — Progress Notes (Signed)
Pharmacy Antibiotic Note  Erin Avery is a 37 y.o. female admitted on 07/12/2023 with IUP at [redacted]w[redacted]d for labor with SROM.  Now with maternal temp and presumed chorioamnionitis. Pharmacy has been consulted for gentamicin dosing.  Plan: Gentamicin 5 mg/kg ABW Q24 hours Will check SCr if abx conitnued >24 hrs    Temp (24hrs), Avg:99.5 F (37.5 C), Min:98 F (36.7 C), Max:101.7 F (38.7 C)  Recent Labs  Lab 07/12/23 1812  WBC 9.6    CrCl cannot be calculated (Patient's most recent lab result is older than the maximum 21 days allowed.).    No Known Allergies  Antimicrobials this admission: Ampicillin 2g Q6 11/27  >>  Gentamicin 5 mg/kg Q24 hr  11/27 >>    Thank you for allowing pharmacy to be a part of this patient's care.  Loyola Mast 07/13/2023 1:11 AM

## 2023-07-13 NOTE — Progress Notes (Signed)
This nurse called MD Judd Lien, about pt BP continuing to be low even after bolus. BP is 95/45 pt also in a lot of pain. Pt is Firm, midline, U/E. Small amount of vaginal bleeding. Pt WBC was 9.6 jumped to 17.3 within 12 hr. MD notified pt and family member concerned and have questions when MD is free they would like to see MD.

## 2023-07-13 NOTE — Lactation Note (Signed)
This note was copied from a baby's chart. Lactation Consultation Note  Patient Name: Erin Avery [id number] Date: 07/13/2023 Age:35 hours Reason for consult: Follow-up assessment;Mother's request;Early term 37-38.6wks;Maternal endocrine disorder;Breastfeeding assistance  P2- MOB requested LC's assistance with latching infant to the breast. MOB had already pumped 1 mL EBM for 5 minutes before LC entered room. MOB wanted infant in the cross cradle hold on the right breast. MOB reported that she had pain in her arm and she needed LC to hold infant to the breast. LC held infant to the breast for the entire 15 minutes infant nursed. Infant nursed okay, but needed stimulation to stay latched. LC used MOB's EBM as stimulation for infant (1 mL). LC encouraged MOB to call for further assistance as needed.   Maternal Data Has patient been taught Hand Expression?: Yes Does the patient have breastfeeding experience prior to this delivery?: Yes How long did the patient breastfeed?: 2 years  Feeding Mother's Current Feeding Choice: Breast Milk and Formula  LATCH Score Latch: Repeated attempts needed to sustain latch, nipple held in mouth throughout feeding, stimulation needed to elicit sucking reflex.  Audible Swallowing: Spontaneous and intermittent  Type of Nipple: Everted at rest and after stimulation  Comfort (Breast/Nipple): Soft / non-tender  Hold (Positioning): Full assist, staff holds infant at breast  LATCH Score: 7   Lactation Tools Discussed/Used Tools: Pump;Flanges Flange Size: 18 Breast pump type: Double-Electric Breast Pump;Manual Pump Education: Setup, frequency, and cleaning;Milk Storage Reason for Pumping: MOB request Pumping frequency: 15-20 min every 2-3 hrs Pumped volume: 1 mL  Interventions Interventions: Breast feeding basics reviewed;Assisted with latch;Breast compression;Adjust position;Support pillows;Position options;Expressed milk;DEBP;Education;LC  Services brochure  Discharge Discharge Education: Warning signs for feeding baby Pump: Personal  Consult Status Consult Status: Follow-up Date: 07/14/23 Follow-up type: In-patient    Dema Severin BS, IBCLC 07/13/2023, 7:00 PM

## 2023-07-13 NOTE — Lactation Note (Signed)
This note was copied from a baby's chart. Lactation Consultation Note  Patient Name: Erin Avery [id number] Date: 07/13/2023 Age:35 hours Reason for consult: Initial assessment;Early term 37-38.6wks;Maternal endocrine disorder  P2- MOB states that infant has not been latching to the breast, so she would like to be set up with the DEBP. MOB also stated that her first child would not latch to the breast, so she became an exclusive pumper. MOB does want infant to latch to the breast, so LC encouraged MOB to call for latching assistance with infant's next feeding. LC set up the DEBP with size 18 mm flanges. MOB confirmed that the flanges felt comfortable. LC left when MOB was still pumping.  LC reviewed CDC milk storage guidelines, feeding infant on cue 8-12x in 24 hrs, not allowing infant to go over 3 hrs without a feeding, LC services handout and the difference between colostrum and mature milk. LC encouraged MOB to call for further assistance as needed.  Maternal Data Does the patient have breastfeeding experience prior to this delivery?: Yes How long did the patient breastfeed?: 2 years  Feeding Mother's Current Feeding Choice: Breast Milk and Formula  Lactation Tools Discussed/Used Tools: Pump;Flanges Flange Size: 18 Breast pump type: Double-Electric Breast Pump;Manual Pump Education: Setup, frequency, and cleaning;Milk Storage Reason for Pumping: MOB's request Pumping frequency: 15-20 min every 2-3 hrs  Interventions Interventions: Breast feeding basics reviewed;DEBP;Education;LC Services brochure  Discharge Discharge Education: Warning signs for feeding baby Pump: DEBP;Personal  Consult Status Consult Status: Follow-up Date: 07/14/23 Follow-up type: In-patient    Dema Severin BS, IBCLC 07/13/2023, 11:49 AM

## 2023-07-14 ENCOUNTER — Encounter (HOSPITAL_COMMUNITY): Payer: Self-pay | Admitting: Obstetrics and Gynecology

## 2023-07-14 DIAGNOSIS — O0993 Supervision of high risk pregnancy, unspecified, third trimester: Secondary | ICD-10-CM | POA: Diagnosis not present

## 2023-07-14 DIAGNOSIS — D62 Acute posthemorrhagic anemia: Secondary | ICD-10-CM | POA: Diagnosis not present

## 2023-07-14 LAB — CBC
HCT: 24.5 % — ABNORMAL LOW (ref 36.0–46.0)
Hemoglobin: 7.6 g/dL — ABNORMAL LOW (ref 12.0–15.0)
MCH: 25.9 pg — ABNORMAL LOW (ref 26.0–34.0)
MCHC: 31 g/dL (ref 30.0–36.0)
MCV: 83.6 fL (ref 80.0–100.0)
Platelets: 157 10*3/uL (ref 150–400)
RBC: 2.93 MIL/uL — ABNORMAL LOW (ref 3.87–5.11)
RDW: 14.7 % (ref 11.5–15.5)
WBC: 9.5 10*3/uL (ref 4.0–10.5)
nRBC: 0 % (ref 0.0–0.2)

## 2023-07-14 LAB — GC/CHLAMYDIA PROBE AMP (~~LOC~~) NOT AT ARMC
Chlamydia: NEGATIVE
Comment: NEGATIVE
Comment: NORMAL
Neisseria Gonorrhea: NEGATIVE

## 2023-07-14 LAB — GLUCOSE, RANDOM: Glucose, Bld: 96 mg/dL (ref 70–99)

## 2023-07-14 MED ORDER — POLYETHYLENE GLYCOL 3350 17 G PO PACK
17.0000 g | PACK | Freq: Every day | ORAL | Status: DC
  Filled 2023-07-14: qty 1

## 2023-07-14 MED ORDER — POLYETHYLENE GLYCOL 3350 17 G PO PACK: 17.0000 g | PACK | Freq: Every day | ORAL | Status: DC

## 2023-07-14 MED ORDER — SENNOSIDES-DOCUSATE SODIUM 8.6-50 MG PO TABS
2.0000 | ORAL_TABLET | ORAL | Status: AC
  Administered 2023-07-15: 2 via ORAL
  Filled 2023-07-14: qty 2

## 2023-07-14 NOTE — Lactation Note (Signed)
This note was copied from a baby's chart. Lactation Consultation Note  Patient Name: Erin Avery [id number] Date: 07/14/2023 Age:35 hours Reason for consult: Follow-up assessment;Early term 37-38.6wks;Infant weight loss;Maternal endocrine disorder  P2- MOB reports that infant is nursing much better today than she was yesterday. MOB has been pumping for 5 minutes before latching infant, then offering the breasts. MOB has also been supplementing with formula. MOB denies further breastfeeding questions or concerns, but states that her insurance denied her DEBP that she was expecting. MOB requested a manual pump to take home and a referral to [facility]. LC provided the manual pump with size 18 mm flange and sent the DEBP referral to [facility]. LC encouraged MOB to call for further assistance as needed.  Maternal Data Has patient been taught Hand Expression?: Yes Does the patient have breastfeeding experience prior to this delivery?: Yes How long did the patient breastfeed?: 2 years  Feeding Mother's Current Feeding Choice: Breast Milk and Formula  Lactation Tools Discussed/Used Tools: Pump;Flanges Flange Size: 18 Breast pump type: Double-Electric Breast Pump;Manual Pump Education: Setup, frequency, and cleaning;Milk Storage Reason for Pumping: MOB's request Pumping frequency: 15-20 min every 2-3 hrs as needed  Interventions Interventions: Breast feeding basics reviewed;Hand pump;Education;LC Services brochure  Discharge Discharge Education: Engorgement and breast care;Warning signs for feeding baby WIC Program: Yes  Consult Status Consult Status: Follow-up Date: 07/15/23 Follow-up type: In-patient    Dema Severin BS, IBCLC 07/14/2023, 3:41 PM

## 2023-07-14 NOTE — Progress Notes (Addendum)
The Rn repeated visitation hours to the parents. At 2000 , the guest were still in the room (too many). The Patient 's guest and patient were  noncompliant with the visitation policy during visitation hours.. The guest finally left after being repeatedly told to leave.

## 2023-07-14 NOTE — Progress Notes (Signed)
The Patient's blood sugar was 93. Patient took with her meter in her arm.

## 2023-07-14 NOTE — Anesthesia Postprocedure Evaluation (Signed)
Anesthesia Post Note  Patient: Erin Avery  Procedure(s) Performed: AN AD HOC LABOR EPIDURAL     Patient location during evaluation: Mother Baby Anesthesia Type: Epidural Level of consciousness: awake Pain management: satisfactory to patient Vital Signs Assessment: post-procedure vital signs reviewed and stable Respiratory status: spontaneous breathing Cardiovascular status: stable Anesthetic complications: no  No notable events documented.  Last Vitals:  Vitals:   07/13/23 2100 07/14/23 0451  BP: (!) 95/59 93/67  Pulse: 83 78  Resp: 18 18  Temp: 36.8 C 36.8 C  SpO2:      Last Pain:  Vitals:   07/14/23 0750  TempSrc:   PainSc: 5    Pain Goal: Patients Stated Pain Goal: 2 (07/14/23 0750)                 Cephus Shelling

## 2023-07-14 NOTE — Plan of Care (Signed)
  Problem: Education: Goal: Knowledge of General Education information will improve Description: Including pain rating scale, medication(s)/side effects and non-pharmacologic comfort measures Outcome: Progressing   Problem: Health Behavior/Discharge Planning: Goal: Ability to manage health-related needs will improve Outcome: Progressing   Problem: Clinical Measurements: Goal: Ability to maintain clinical measurements within normal limits will improve Outcome: Progressing Goal: Will remain free from infection Outcome: Progressing Goal: Diagnostic test results will improve Outcome: Progressing Goal: Respiratory complications will improve Outcome: Progressing Goal: Cardiovascular complication will be avoided Outcome: Progressing   Problem: Activity: Goal: Risk for activity intolerance will decrease Outcome: Progressing   Problem: Nutrition: Goal: Adequate nutrition will be maintained Outcome: Progressing   Problem: Coping: Goal: Level of anxiety will decrease Outcome: Progressing   Problem: Elimination: Goal: Will not experience complications related to bowel motility Outcome: Progressing Goal: Will not experience complications related to urinary retention Outcome: Progressing   Problem: Pain Management: Goal: General experience of comfort will improve Outcome: Progressing   Problem: Safety: Goal: Ability to remain free from injury will improve Outcome: Progressing   Problem: Skin Integrity: Goal: Risk for impaired skin integrity will decrease Outcome: Progressing   Problem: Education: Goal: Knowledge of Childbirth will improve Outcome: Progressing Goal: Ability to make informed decisions regarding treatment and plan of care will improve Outcome: Progressing Goal: Ability to state and carry out methods to decrease the pain will improve Outcome: Progressing Goal: Individualized Educational Video(s) Outcome: Progressing   Problem: Coping: Goal: Ability to  verbalize concerns and feelings about labor and delivery will improve Outcome: Progressing   Problem: Life Cycle: Goal: Ability to make normal progression through stages of labor will improve Outcome: Progressing Goal: Ability to effectively push during vaginal delivery will improve Outcome: Progressing   Problem: Role Relationship: Goal: Will demonstrate positive interactions with the child Outcome: Progressing   Problem: Safety: Goal: Risk of complications during labor and delivery will decrease Outcome: Progressing   Problem: Pain Management: Goal: Relief or control of pain from uterine contractions will improve Outcome: Progressing   Problem: Education: Goal: Knowledge of condition will improve Outcome: Progressing Goal: Individualized Educational Video(s) Outcome: Progressing Goal: Individualized Newborn Educational Video(s) Outcome: Progressing   Problem: Activity: Goal: Will verbalize the importance of balancing activity with adequate rest periods Outcome: Progressing Goal: Ability to tolerate increased activity will improve Outcome: Progressing   Problem: Coping: Goal: Ability to identify and utilize available resources and services will improve Outcome: Progressing   Problem: Life Cycle: Goal: Chance of risk for complications during the postpartum period will decrease Outcome: Progressing   Problem: Role Relationship: Goal: Ability to demonstrate positive interaction with newborn will improve Outcome: Progressing   Problem: Skin Integrity: Goal: Demonstration of wound healing without infection will improve Outcome: Progressing

## 2023-07-14 NOTE — Plan of Care (Signed)
Problem: Education: Goal: Knowledge of General Education information will improve Description: Including pain rating scale, medication(s)/side effects and non-pharmacologic comfort measures 07/14/2023 1557 by Donne Hazel, LPN Outcome: Progressing 07/14/2023 1554 by Donne Hazel, LPN Outcome: Progressing   Problem: Health Behavior/Discharge Planning: Goal: Ability to manage health-related needs will improve 07/14/2023 1557 by Donne Hazel, LPN Outcome: Progressing 07/14/2023 1554 by Donne Hazel, LPN Outcome: Progressing   Problem: Clinical Measurements: Goal: Ability to maintain clinical measurements within normal limits will improve 07/14/2023 1557 by Donne Hazel, LPN Outcome: Progressing 07/14/2023 1554 by Donne Hazel, LPN Outcome: Progressing Goal: Will remain free from infection 07/14/2023 1557 by Donne Hazel, LPN Outcome: Progressing 07/14/2023 1554 by Donne Hazel, LPN Outcome: Progressing Goal: Diagnostic test results will improve 07/14/2023 1557 by Donne Hazel, LPN Outcome: Progressing 07/14/2023 1554 by Donne Hazel, LPN Outcome: Progressing Goal: Respiratory complications will improve 07/14/2023 1557 by Donne Hazel, LPN Outcome: Progressing 07/14/2023 1554 by Donne Hazel, LPN Outcome: Progressing Goal: Cardiovascular complication will be avoided 07/14/2023 1557 by Donne Hazel, LPN Outcome: Progressing 07/14/2023 1554 by Donne Hazel, LPN Outcome: Progressing   Problem: Activity: Goal: Risk for activity intolerance will decrease 07/14/2023 1557 by Donne Hazel, LPN Outcome: Progressing 07/14/2023 1554 by Donne Hazel, LPN Outcome: Progressing   Problem: Nutrition: Goal: Adequate nutrition will be maintained 07/14/2023 1557 by Donne Hazel, LPN Outcome: Progressing 07/14/2023 1554 by Donne Hazel, LPN Outcome: Progressing   Problem: Coping: Goal: Level of anxiety will decrease 07/14/2023 1557 by Donne Hazel, LPN Outcome: Progressing 07/14/2023 1554 by Donne Hazel, LPN Outcome: Progressing   Problem: Elimination: Goal: Will not experience complications related to bowel motility 07/14/2023 1557 by Donne Hazel, LPN Outcome: Progressing 07/14/2023 1554 by Donne Hazel, LPN Outcome: Progressing Goal: Will not experience complications related to urinary retention 07/14/2023 1557 by Donne Hazel, LPN Outcome: Progressing 07/14/2023 1554 by Donne Hazel, LPN Outcome: Progressing   Problem: Pain Management: Goal: General experience of comfort will improve 07/14/2023 1557 by Donne Hazel, LPN Outcome: Progressing 07/14/2023 1554 by Donne Hazel, LPN Outcome: Progressing   Problem: Safety: Goal: Ability to remain free from injury will improve 07/14/2023 1557 by Donne Hazel, LPN Outcome: Progressing 07/14/2023 1554 by Donne Hazel, LPN Outcome: Progressing   Problem: Skin Integrity: Goal: Risk for impaired skin integrity will decrease 07/14/2023 1557 by Donne Hazel, LPN Outcome: Progressing 07/14/2023 1554 by Donne Hazel, LPN Outcome: Progressing   Problem: Education: Goal: Knowledge of Childbirth will improve 07/14/2023 1557 by Donne Hazel, LPN Outcome: Progressing 07/14/2023 1554 by Donne Hazel, LPN Outcome: Progressing Goal: Ability to make informed decisions regarding treatment and plan of care will improve 07/14/2023 1557 by Donne Hazel, LPN Outcome: Progressing 07/14/2023 1554 by Donne Hazel, LPN Outcome: Progressing Goal: Ability to state and carry out methods to decrease the pain will improve 07/14/2023 1557 by Donne Hazel, LPN Outcome: Progressing 07/14/2023 1554 by Donne Hazel, LPN Outcome: Progressing Goal: Individualized Educational Video(s) 07/14/2023 1557 by Donne Hazel, LPN Outcome: Progressing 07/14/2023 1554 by Donne Hazel, LPN Outcome: Progressing   Problem: Coping: Goal: Ability to verbalize  concerns and feelings about labor and delivery will improve 07/14/2023 1557 by Donne Hazel, LPN Outcome: Progressing 07/14/2023 1554 by Donne Hazel, LPN Outcome: Progressing   Problem: Life Cycle: Goal: Ability to make normal progression through stages of  labor will improve 07/14/2023 1557 by Donne Hazel, LPN Outcome: Progressing 07/14/2023 1554 by Donne Hazel, LPN Outcome: Progressing Goal: Ability to effectively push during vaginal delivery will improve 07/14/2023 1557 by Donne Hazel, LPN Outcome: Progressing 07/14/2023 1554 by Donne Hazel, LPN Outcome: Progressing   Problem: Role Relationship: Goal: Will demonstrate positive interactions with the child 07/14/2023 1557 by Donne Hazel, LPN Outcome: Progressing 07/14/2023 1554 by Donne Hazel, LPN Outcome: Progressing   Problem: Safety: Goal: Risk of complications during labor and delivery will decrease 07/14/2023 1557 by Donne Hazel, LPN Outcome: Progressing 07/14/2023 1554 by Donne Hazel, LPN Outcome: Progressing   Problem: Pain Management: Goal: Relief or control of pain from uterine contractions will improve 07/14/2023 1557 by Donne Hazel, LPN Outcome: Progressing 07/14/2023 1554 by Donne Hazel, LPN Outcome: Progressing   Problem: Education: Goal: Knowledge of condition will improve 07/14/2023 1557 by Donne Hazel, LPN Outcome: Progressing 07/14/2023 1554 by Donne Hazel, LPN Outcome: Progressing Goal: Individualized Educational Video(s) 07/14/2023 1557 by Donne Hazel, LPN Outcome: Progressing 07/14/2023 1554 by Donne Hazel, LPN Outcome: Progressing Goal: Individualized Newborn Educational Video(s) 07/14/2023 1557 by Donne Hazel, LPN Outcome: Progressing 07/14/2023 1554 by Donne Hazel, LPN Outcome: Progressing   Problem: Activity: Goal: Will verbalize the importance of balancing activity with adequate rest periods 07/14/2023 1557 by Donne Hazel,  LPN Outcome: Progressing 07/14/2023 1554 by Donne Hazel, LPN Outcome: Progressing Goal: Ability to tolerate increased activity will improve 07/14/2023 1557 by Donne Hazel, LPN Outcome: Progressing 07/14/2023 1554 by Donne Hazel, LPN Outcome: Progressing   Problem: Coping: Goal: Ability to identify and utilize available resources and services will improve 07/14/2023 1557 by Donne Hazel, LPN Outcome: Progressing 07/14/2023 1554 by Donne Hazel, LPN Outcome: Progressing   Problem: Life Cycle: Goal: Chance of risk for complications during the postpartum period will decrease 07/14/2023 1557 by Donne Hazel, LPN Outcome: Progressing 07/14/2023 1554 by Donne Hazel, LPN Outcome: Progressing   Problem: Role Relationship: Goal: Ability to demonstrate positive interaction with newborn will improve 07/14/2023 1557 by Donne Hazel, LPN Outcome: Progressing 07/14/2023 1554 by Donne Hazel, LPN Outcome: Progressing   Problem: Skin Integrity: Goal: Demonstration of wound healing without infection will improve 07/14/2023 1557 by Donne Hazel, LPN Outcome: Progressing 07/14/2023 1554 by Donne Hazel, LPN Outcome: Progressing

## 2023-07-14 NOTE — Progress Notes (Signed)
Daily Post Partum Note  07/14/2023 Erin Avery is a 37 y.o. [id number] PPD#1 s/p VBAC/3c at [redacted]w[redacted]d with delivery c/b chorio.   Pregnancy c/b GDMa2 (metformin), AMA, h/o prior c-section 24hr/overnight events:  none  Subjective:  Feels tired but no HA or overt weak or dizziness. Lochia wnl.   Objective:    Current Vital Signs 24h Vital Sign Ranges  T 98.3 F (36.8 C) Temp  Avg: 98.2 F (36.8 C)  Min: 98 F (36.7 C)  Max: 98.3 F (36.8 C)  BP 93/67 BP  Min: 93/67  Max: 111/65  HR 78 Pulse  Avg: 82.8  Min: 78  Max: 87  RR 18 Resp  Avg: 17  Min: 16  Max: 18  SaO2 99 %   No data recorded       24 Hour I/O Current Shift I/O  Time Ins Outs 11/28 0701 - 11/29 0700 In: 327.6 [I.V.:102.2] Out: -  No intake/output data recorded.    General: NAD Abdomen: soft, nttp. Perineum: deferred Skin:  Warm and dry.  Respiratory:  Normal respiratory effort  Medications Current Facility-Administered Medications  Medication Dose Route Frequency Provider Last Rate Last Admin   acetaminophen (TYLENOL) tablet 650 mg  650 mg Oral Q4H PRN Gerrit Heck, CNM   650 mg at 07/14/23 0752   benzocaine-Menthol (DERMOPLAST) 20-0.5 % topical spray 1 Application  1 Application Topical PRN Gerrit Heck, CNM   1 Application at 07/13/23 0551   coconut oil  1 Application Topical PRN Gerrit Heck, CNM       witch hazel-glycerin (TUCKS) pad 1 Application  1 Application Topical PRN Gerrit Heck, CNM       And   dibucaine (NUPERCAINAL) 1 % rectal ointment 1 Application  1 Application Rectal PRN Gerrit Heck, CNM       diphenhydrAMINE (BENADRYL) capsule 25 mg  25 mg Oral Q6H PRN Gerrit Heck, CNM       [START ON 07/15/2023] ferrous sulfate tablet 325 mg  325 mg Oral QODAY Cresenzo-Dishmon, Scarlette Calico, CNM       ibuprofen (ADVIL) tablet 600 mg  600 mg Oral Q6H Gerrit Heck, CNM   600 mg at 07/14/23 0508   ondansetron (ZOFRAN) tablet 4 mg  4 mg Oral Q4H PRN Gerrit Heck, CNM       Or   ondansetron (ZOFRAN)  injection 4 mg  4 mg Intravenous Q4H PRN Gerrit Heck, CNM       oxyCODONE (Oxy IR/ROXICODONE) immediate release tablet 5-10 mg  5-10 mg Oral Q6H PRN Hessie Dibble, MD   5 mg at 07/13/23 2134   polyethylene glycol (MIRALAX / GLYCOLAX) packet 17 g  17 g Oral Q supper [place] Bing, MD       prenatal multivitamin tablet 1 tablet  1 tablet Oral Q1200 Gerrit Heck, CNM   1 tablet at 07/13/23 1203   [START ON 07/15/2023] senna-docusate (Senokot-S) tablet 2 tablet  2 tablet Oral Q24H [place] Bing, MD       simethicone (MYLICON) chewable tablet 80 mg  80 mg Oral PRN Gerrit Heck, CNM       Tdap (BOOSTRIX) injection 0.5 mL  0.5 mL Intramuscular Once Emly, Jessica, CNM       zolpidem (AMBIEN) tablet 5 mg  5 mg Oral QHS PRN Gerrit Heck, CNM        Labs:  Recent Labs  Lab 07/12/23 1812 07/13/23 0543 07/14/23 0505  WBC 9.6 17.3* 9.5  HGB 10.8* 9.0* 7.6*  HCT 34.0* 28.0*  24.5*  PLT 196 175 157   Recent Labs  Lab 07/14/23 0505  GLUCOSE 96    Assessment & Plan:  Patient stable *Postpartum/postop: Routine care. D/w her that PP IUD couldn't be placed immediately due to chorio but okay to place at 4-6wk PP visit *Chorio: no s/s of lingering s/s *Anemia: d/w her that if she doesn't need blood that I recommend IV iron; pt already on PO iron. I d/w her that she's borderline with s/s for need for 1U PRBC and that if she has overt s/s I recommend it. Pt to continue to monitor. Repeat cbc tomorrow morning *GDMA2: okay to stay off meds *3c laceration: miralax added to regimen. 7-10d incision check request sent *Dispo: sometime tomorrow  AB POS / RPR neg/ HIV neg/girl/breast/outpatient IUD Cornelia Copa MD Attending Center for [facility])

## 2023-07-14 NOTE — Progress Notes (Addendum)
The FOB the baby had left to go home to sleep  the night of 07/13/2023. The patient's niece stayed with the patient and assisted the patient with the baby. The niece offered great assistance. Tonight the FOB asked if baby could go to nursery. The Rn explained that the babies stay in the parent's rooms.

## 2023-07-14 NOTE — Progress Notes (Addendum)
The Incoming  am Rn called MD on call requesting CBC for AM . The MOB was bolus with IV fluids in the am. Per report BPs have been 90s over 60s mostly., the day Rn had called  for an order for IV bolus.  The FOB was concerned earlier per shift report about how sleepy and exhausted MOB was earlier. The MOB had delivered her 1st baby by Csection and this baby vaginally. MOB had a 3rd degree of perineum type 3 C tear . EBL reported was 357.  WBC count went from 9.6 to 17.3.   The Rn came in at 0100 am to take over the patient's care , the Rn received shift report  and at this time, the MOB was sleeping peacefully.

## 2023-07-15 LAB — CBC
HCT: 26.1 % — ABNORMAL LOW (ref 36.0–46.0)
Hemoglobin: 8.1 g/dL — ABNORMAL LOW (ref 12.0–15.0)
MCH: 26.2 pg (ref 26.0–34.0)
MCHC: 31 g/dL (ref 30.0–36.0)
MCV: 84.5 fL (ref 80.0–100.0)
Platelets: 189 10*3/uL (ref 150–400)
RBC: 3.09 MIL/uL — ABNORMAL LOW (ref 3.87–5.11)
RDW: 14.6 % (ref 11.5–15.5)
WBC: 8.3 10*3/uL (ref 4.0–10.5)
nRBC: 0 % (ref 0.0–0.2)

## 2023-07-15 MED ORDER — FERROUS FUMARATE 150 MG PO TABS: 1.0000 | ORAL_TABLET | ORAL | 3 refills | Status: DC

## 2023-07-15 MED ORDER — IBUPROFEN 600 MG PO TABS: 600.0000 mg | ORAL_TABLET | Freq: Four times a day (QID) | ORAL | 0 refills | Status: DC | PRN

## 2023-07-15 NOTE — Lactation Note (Signed)
This note was copied from a baby's chart. Lactation Consultation Note  Patient Name: Erin Avery [id number] Date: 07/15/2023 Age:35 hours Reason for consult: Follow-up assessment;Early term 37-38.6wks;Maternal endocrine disorder  P2, Mother recently pumped 15 ml.  Will give to baby when she wakes.  Mother states her insurance is sending a pump.  Mother inquired about formula.  Suggest discussing with WIC to see if they qualify.  Reviewed engorgement care and monitoring voids/stools.  Feeding Mother's Current Feeding Choice: Breast Milk and Formula Nipple Type: Slow - flow  Lactation Tools Discussed/Used Tools: Pump;Flanges Flange Size: 18 Breast pump type: Double-Electric Breast Pump;Manual Pumping frequency: q 3 hours 15 min Pumped volume: 15 mL  Interventions Interventions: Education;DEBP  Discharge Discharge Education: Engorgement and breast care;Warning signs for feeding baby  Consult Status Consult Status: Complete Date: 07/15/23    [name] [facility] 07/15/2023, 10:13 AM

## 2023-07-15 NOTE — Plan of Care (Signed)
  Problem: Education: Goal: Knowledge of General Education information will improve Description: Including pain rating scale, medication(s)/side effects and non-pharmacologic comfort measures Outcome: Adequate for Discharge   Problem: Health Behavior/Discharge Planning: Goal: Ability to manage health-related needs will improve Outcome: Adequate for Discharge   Problem: Clinical Measurements: Goal: Ability to maintain clinical measurements within normal limits will improve Outcome: Adequate for Discharge Goal: Will remain free from infection Outcome: Adequate for Discharge Goal: Diagnostic test results will improve Outcome: Adequate for Discharge Goal: Respiratory complications will improve Outcome: Adequate for Discharge Goal: Cardiovascular complication will be avoided Outcome: Adequate for Discharge   Problem: Activity: Goal: Risk for activity intolerance will decrease Outcome: Adequate for Discharge   Problem: Nutrition: Goal: Adequate nutrition will be maintained Outcome: Adequate for Discharge   Problem: Coping: Goal: Level of anxiety will decrease Outcome: Adequate for Discharge   Problem: Elimination: Goal: Will not experience complications related to bowel motility Outcome: Adequate for Discharge Goal: Will not experience complications related to urinary retention Outcome: Adequate for Discharge   Problem: Pain Management: Goal: General experience of comfort will improve Outcome: Adequate for Discharge   Problem: Safety: Goal: Ability to remain free from injury will improve Outcome: Adequate for Discharge   Problem: Skin Integrity: Goal: Risk for impaired skin integrity will decrease Outcome: Adequate for Discharge   Problem: Education: Goal: Knowledge of Childbirth will improve Outcome: Adequate for Discharge Goal: Ability to make informed decisions regarding treatment and plan of care will improve Outcome: Adequate for Discharge Goal: Ability to state  and carry out methods to decrease the pain will improve Outcome: Adequate for Discharge Goal: Individualized Educational Video(s) Outcome: Adequate for Discharge   Problem: Coping: Goal: Ability to verbalize concerns and feelings about labor and delivery will improve Outcome: Adequate for Discharge   Problem: Life Cycle: Goal: Ability to make normal progression through stages of labor will improve Outcome: Adequate for Discharge Goal: Ability to effectively push during vaginal delivery will improve Outcome: Adequate for Discharge   Problem: Role Relationship: Goal: Will demonstrate positive interactions with the child Outcome: Adequate for Discharge   Problem: Safety: Goal: Risk of complications during labor and delivery will decrease Outcome: Adequate for Discharge   Problem: Pain Management: Goal: Relief or control of pain from uterine contractions will improve Outcome: Adequate for Discharge   Problem: Education: Goal: Knowledge of condition will improve Outcome: Adequate for Discharge Goal: Individualized Educational Video(s) Outcome: Adequate for Discharge Goal: Individualized Newborn Educational Video(s) Outcome: Adequate for Discharge   Problem: Activity: Goal: Will verbalize the importance of balancing activity with adequate rest periods Outcome: Adequate for Discharge Goal: Ability to tolerate increased activity will improve Outcome: Adequate for Discharge   Problem: Coping: Goal: Ability to identify and utilize available resources and services will improve Outcome: Adequate for Discharge   Problem: Life Cycle: Goal: Chance of risk for complications during the postpartum period will decrease Outcome: Adequate for Discharge   Problem: Role Relationship: Goal: Ability to demonstrate positive interaction with newborn will improve Outcome: Adequate for Discharge   Problem: Skin Integrity: Goal: Demonstration of wound healing without infection will  improve Outcome: Adequate for Discharge

## 2023-07-15 NOTE — Discharge Instructions (Signed)
WHAT TO LOOK OUT FOR: Fever of 100.4 or above Mastitis: feels like flu and breasts hurt Infection: increased pain, swelling or redness Blood clots golf ball size or larger Postpartum depression   Congratulations on your newest addition!

## 2023-07-17 ENCOUNTER — Ambulatory Visit: Payer: Medicaid Other

## 2023-07-17 ENCOUNTER — Encounter: Payer: Medicaid Other | Admitting: Obstetrics and Gynecology

## 2023-07-17 LAB — SURGICAL PATHOLOGY

## 2023-07-19 ENCOUNTER — Ambulatory Visit: Payer: Medicaid Other | Admitting: Obstetrics and Gynecology

## 2023-07-19 ENCOUNTER — Encounter: Payer: Self-pay | Admitting: Obstetrics and Gynecology

## 2023-07-19 ENCOUNTER — Encounter: Payer: Medicaid Other | Admitting: Advanced Practice Midwife

## 2023-07-19 ENCOUNTER — Ambulatory Visit: Payer: Medicaid Other | Admitting: Advanced Practice Midwife

## 2023-07-20 DIAGNOSIS — O0993 Supervision of high risk pregnancy, unspecified, third trimester: Secondary | ICD-10-CM

## 2023-07-20 NOTE — Progress Notes (Signed)
Patient did not keep her postpartum perineum follow up appointment for 07/19/2023.  Cornelia Copa MD Attending Center for [employer] [facility])

## 2023-07-21 ENCOUNTER — Encounter (HOSPITAL_COMMUNITY)
Admission: RE | Admit: 2023-07-21 | Discharge: 2023-07-21 | Disposition: A | Discharge status: Home / Self Care | Payer: Medicaid Other | Source: Ambulatory Visit | Attending: Family Medicine | Admitting: Family Medicine

## 2023-07-21 HISTORY — DX: Other complications of anesthesia, initial encounter: T88.59XA

## 2023-07-21 HISTORY — DX: Gestational diabetes mellitus in pregnancy, unspecified control: O24.419

## 2023-07-24 ENCOUNTER — Inpatient Hospital Stay (HOSPITAL_COMMUNITY): Admission: AD | Admit: 2023-07-24 | Payer: Medicaid Other | Source: Home / Self Care | Admitting: Family Medicine

## 2023-07-24 ENCOUNTER — Telehealth (HOSPITAL_COMMUNITY): Payer: Self-pay | Admitting: *Deleted

## 2023-07-24 ENCOUNTER — Encounter (HOSPITAL_COMMUNITY): Admission: AD | Payer: Self-pay | Source: Home / Self Care

## 2023-07-24 ENCOUNTER — Ambulatory Visit: Payer: Medicaid Other

## 2023-07-24 DIAGNOSIS — O0993 Supervision of high risk pregnancy, unspecified, third trimester: Secondary | ICD-10-CM

## 2023-07-24 SURGERY — Surgical Case
Anesthesia: Regional

## 2023-07-24 NOTE — Telephone Encounter (Signed)
07/24/2023  Name: Erin Avery MRN: [id number] DOB: Aug 10, 1986  Reason for Call:  Transition of Care Hospital Discharge Call  Contact Status: Patient Contact Status: Complete  Language assistant needed: Interpreter Mode: Interpreter Not Needed        Follow-Up Questions: Do You Have Any Concerns About Your Health As You Heal From Delivery?: Yes What Concerns Do You Have About Your Health?: Still having perineal pain where laceration is healing.  Also is having diffuculty passing uring - she has to "bend myself" in order to urinate.  Denies fever.  Advised patient to call provider for appointment to have perineal check and discuss urinary difficulties. Do You Have Any Concerns About Your Infants Health?: No  Edinburgh Postnatal Depression Scale:  In the Past 7 Days: I have been able to laugh and see the funny side of things.: As much as I always could I have looked forward with enjoyment to things.: Rather less than I used to I have blamed myself unnecessarily when things went wrong.: No, never I have been anxious or worried for no good reason.: No, not at all I have felt scared or panicky for no good reason.: No, not at all Things have been getting on top of me.: No, I have been coping as well as ever I have been so unhappy that I have had difficulty sleeping.: Not at all I have felt sad or miserable.: No, not at all I have been so unhappy that I have been crying.: No, never The thought of harming myself has occurred to me.: Never Inocente Salles Postnatal Depression Scale Total: 1  PHQ2-9 Depression Scale:     Discharge Follow-up: Edinburgh score requires follow up?: No Patient was advised of the following resources:: Support Group, Breastfeeding Support Group (declines postpartum group information via email)  Post-discharge interventions: Reviewed Newborn Safe Sleep Practices  [name], RN 07/24/2023 15:51

## 2023-07-25 ENCOUNTER — Encounter (HOSPITAL_COMMUNITY): Payer: Self-pay

## 2023-07-25 ENCOUNTER — Emergency Department (HOSPITAL_COMMUNITY)
Admission: EM | Admit: 2023-07-25 | Discharge: 2023-07-25 | Discharge status: Against Medical Advice | Payer: Medicaid Other | Attending: Emergency Medicine | Admitting: Emergency Medicine

## 2023-07-25 DIAGNOSIS — O99893 Other specified diseases and conditions complicating puerperium: Secondary | ICD-10-CM | POA: Diagnosis not present

## 2023-07-25 DIAGNOSIS — R39198 Other difficulties with micturition: Secondary | ICD-10-CM | POA: Insufficient documentation

## 2023-07-25 DIAGNOSIS — Z5321 Procedure and treatment not carried out due to patient leaving prior to being seen by health care provider: Secondary | ICD-10-CM | POA: Diagnosis not present

## 2023-07-25 LAB — URINALYSIS, ROUTINE W REFLEX MICROSCOPIC
Bacteria, UA: NONE SEEN
Bilirubin Urine: NEGATIVE
Glucose, UA: NEGATIVE mg/dL
Ketones, ur: NEGATIVE mg/dL
Nitrite: NEGATIVE
Protein, ur: NEGATIVE mg/dL
Specific Gravity, Urine: 1.015 (ref 1.005–1.030)
pH: 5 (ref 5.0–8.0)

## 2023-07-25 NOTE — ED Provider Triage Note (Signed)
Emergency Medicine Provider Triage Evaluation Note  Erin Avery , a 37 y.o. female  was evaluated in triage.  Pt complains of not being able to urinate properly for the past month. Patient is able to urinate but states it takes more effort than normal. Patient had c-section end of last month and had symptoms since then. Denies abd pain, NVD, dysuria, hematuria, flank pain, fevers.  Review of Systems  Positive:  Negative:   Physical Exam  BP 116/80 (BP Location: Right Arm)   Pulse 85   Temp 98.4 F (36.9 C) (Oral)   Resp 16   LMP 10/14/2022   SpO2 99%  Gen:   Awake, no distress   Resp:  Normal effort  MSK:   Moves extremities without difficulty  Other:  Abd nontender without peritoneal signs, no CVA tenderness  Medical Decision Making  Medically screening exam initiated at 5:58 PM.  Appropriate orders placed.  Erin Avery was informed that the remainder of the evaluation will be completed by another provider, this initial triage assessment does not replace that evaluation, and the importance of remaining in the ED until their evaluation is complete.  Workup initiated, patient stable   Erin Avery, [place] 07/25/23 1801

## 2023-07-25 NOTE — ED Triage Notes (Signed)
Patient had a regular vaginal birth on the 30th of November, she did tear during birth and she required some suturing. Since then the patient reports she has had difficult urinating and requiring her to strain to pee.

## 2023-07-25 NOTE — ED Notes (Signed)
Stated they was leaving and seen leaving er

## 2023-08-31 ENCOUNTER — Ambulatory Visit: Payer: Medicaid Other | Admitting: Obstetrics and Gynecology

## 2023-08-31 ENCOUNTER — Other Ambulatory Visit: Payer: Self-pay

## 2023-08-31 ENCOUNTER — Encounter: Payer: Self-pay | Admitting: Obstetrics and Gynecology

## 2023-08-31 DIAGNOSIS — Z30430 Encounter for insertion of intrauterine contraceptive device: Secondary | ICD-10-CM | POA: Diagnosis not present

## 2023-08-31 DIAGNOSIS — Z3202 Encounter for pregnancy test, result negative: Secondary | ICD-10-CM | POA: Diagnosis not present

## 2023-08-31 LAB — POCT PREGNANCY, URINE: Preg Test, Ur: NEGATIVE

## 2023-08-31 MED ORDER — LEVONORGESTREL 20.1 MCG/DAY IU IUD
1.0000 | INTRAUTERINE_SYSTEM | Freq: Once | INTRAUTERINE | Status: AC
  Administered 2023-08-31: 1 via INTRAUTERINE

## 2023-08-31 NOTE — Progress Notes (Signed)
Pt wants to discuss both IUD & Nexplanon

## 2023-08-31 NOTE — Progress Notes (Signed)
    GYNECOLOGY OFFICE PROCEDURE NOTE  Yaqueline Ramlall is a 38 y.o. [id number] here for Liletta IUD insertion. No GYN concerns.  Last pap smear was on 3/23 and was normal.  IUD Insertion Procedure Note Patient identified, informed consent performed, consent signed.   Discussed risks of irregular bleeding, cramping, infection, malpositioning or misplacement of the IUD outside the uterus which may require further procedure such as laparoscopy. Also discussed >99% contraception efficacy, increased risk of ectopic pregnancy with failure of method.  Time out was performed.  Urine pregnancy test negative.  Speculum placed in the vagina.  Cervix visualized.  Cleaned with Betadine x 2.  Grasped anteriorly with a single tooth tenaculum.  Uterus sounded to 7 cm.  Liletta IUD placed per manufacturer's recommendations.  Strings trimmed to 3 cm. Tenaculum was removed, good hemostasis noted.  Patient tolerated procedure well.   Patient was given post-procedure instructions.  She was advised to have backup contraception for one week.  Patient was also asked to check IUD strings periodically and follow up in 4 weeks for IUD check.   Mariel Aloe, MD, FACOG Obstetrician & Gynecologist, [facility] for [facility], [facility]

## 2023-08-31 NOTE — Addendum Note (Signed)
Addended by: Henrietta Dine on: 08/31/2023 03:50 PM   Modules accepted: Orders

## 2023-08-31 NOTE — Progress Notes (Signed)
Post Partum Visit Note  Erin Avery is a 38 y.o. [phone] female who presents for a postpartum visit. She is 7 weeks postpartum following a normal spontaneous vaginal delivery.  I have fully reviewed the prenatal and intrapartum course. The delivery was at 38/0 gestational weeks.  Anesthesia: epidural. Postpartum course has been uncomplicated. Baby is doing well. Baby is feeding by both breast and bottle - [employer] Good Start. Bleeding no bleeding. Bowel function is normal. Bladder function is normal. Patient is not sexually active. Contraception method is none. Postpartum depression screening: negative.   The pregnancy intention screening data noted above was reviewed. Potential methods of contraception were discussed. The patient elected to proceed with IUD placement.   Edinburgh Postnatal Depression Scale - 08/31/23 1424       Edinburgh Postnatal Depression Scale:  In the Past 7 Days   I have been able to laugh and see the funny side of things. 0    I have looked forward with enjoyment to things. 0    I have blamed myself unnecessarily when things went wrong. 0    I have been anxious or worried for no good reason. 0    I have felt scared or panicky for no good reason. 0    Things have been getting on top of me. 0    I have been so unhappy that I have had difficulty sleeping. 0    I have felt sad or miserable. 0    I have been so unhappy that I have been crying. 0    The thought of harming myself has occurred to me. 0    Edinburgh Postnatal Depression Scale Total 0             Health Maintenance Due  Topic Date Due   FOOT EXAM  Never done   OPHTHALMOLOGY EXAM  Never done   DTaP/Tdap/Td (1 - Tdap) Never done   COVID-19 Vaccine (1 - 2024-25 season) Never done    The following portions of the patient's history were reviewed and updated as appropriate: allergies, current medications, past family history, past medical history, past social history, past surgical history,  and problem list.  Review of Systems Pertinent items are noted in HPI.  Objective:  BP 108/76   Pulse 84   Ht 4\' 8"  (1.422 m)   Wt 149 lb 9.6 oz (67.9 kg)   LMP 10/14/2022   Breastfeeding Yes   BMI 33.54 kg/m    General:  alert, cooperative, appears stated age, and no distress   Breasts:  not indicated  Lungs: clear to auscultation bilaterally  Heart:  regular rate and rhythm  Abdomen: soft, non-tender; bowel sounds normal; no masses,  no organomegaly   Wound N/a  GU exam:  normal      No granulation tissue noted, mild tenderness with rectal pressure Assessment:    Encounter for postpartum care  normal postpartum exam.   Plan:   Essential components of care per ACOG recommendations:  1.  Mood and well being: Patient with negative depression screening today. Reviewed local resources for support.  - Patient tobacco use? No.   - hx of drug use? No.    2. Infant care and feeding:  -Patient currently breastmilk feeding? Yes. Reviewed importance of draining breast regularly to support lactation.  -Social determinants of health (SDOH) reviewed in EPIC. No concerns.  3. Sexuality, contraception and birth spacing - Patient does not want a pregnancy in the next year.  Desired  family size is 2 children.  - Reviewed reproductive life planning. Reviewed contraceptive methods based on pt preferences and effectiveness.  Patient desired IUD or IUS today.   - Discussed birth spacing of 18 months  4. Sleep and fatigue -Encouraged family/partner/community support of 4 hrs of uninterrupted sleep to help with mood and fatigue  5. Physical Recovery  - Discussed patients delivery and complications. She describes her labor as good. - Patient had a Vaginal, no problems at delivery. Patient had a 3rd degree laceration. Perineal healing reviewed. Patient expressed understanding - Patient has urinary incontinence? No. - Patient is safe to resume physical and sexual activity  6.  Health  Maintenance - HM due items addressed Yes - Last pap smear  Diagnosis  Date Value Ref Range Status  11/04/2021   Final   - Negative for intraepithelial lesion or malignancy (NILM)   Pap smear not done at today's visit.  -Breast Cancer screening indicated? No.   7. Chronic Disease/Pregnancy Condition follow up: None  - PCP follow up  Warden Fillers, MD [facility], [facility]

## 2023-10-02 ENCOUNTER — Ambulatory Visit: Payer: Medicaid Other | Admitting: Obstetrics and Gynecology

## 2023-10-02 ENCOUNTER — Encounter: Payer: Self-pay | Admitting: Obstetrics and Gynecology

## 2023-10-02 ENCOUNTER — Other Ambulatory Visit: Payer: Self-pay

## 2023-10-02 VITALS — BP 112/81 | HR 91 | Wt 155.7 lb

## 2023-10-02 DIAGNOSIS — Z30431 Encounter for routine checking of intrauterine contraceptive device: Secondary | ICD-10-CM | POA: Diagnosis not present

## 2023-10-02 DIAGNOSIS — L989 Disorder of the skin and subcutaneous tissue, unspecified: Secondary | ICD-10-CM

## 2023-10-02 DIAGNOSIS — Z975 Presence of (intrauterine) contraceptive device: Secondary | ICD-10-CM | POA: Insufficient documentation

## 2023-10-02 NOTE — Progress Notes (Signed)
   GYNECOLOGY PROGRESS NOTE  History:  38 y.o. [id number] presents to [facility] for IUD string check. Liletta IUD inserted 08/31/23. Reports scant bleeding.   Reports spot on outside of nose that has been present for the last 3-4 months. She reports it is growing. Denies pain or bleeding from site.   The following portions of the patient's history were reviewed and updated as appropriate: allergies, current medications, past family history, past medical history, past social history, past surgical history and problem list. Last pap smear on 11/04/21 was normal.  Health Maintenance Due  Topic Date Due   FOOT EXAM  Never done   OPHTHALMOLOGY EXAM  Never done   DTaP/Tdap/Td (1 - Tdap) Never done   COVID-19 Vaccine (1 - 2024-25 season) Never done     Review of Systems:  Pertinent items are noted in HPI.   Objective:  Physical Exam Blood pressure 112/81, pulse 91, weight 155 lb 11.2 oz (70.6 kg), currently breastfeeding. VS reviewed, nursing note reviewed,  Constitutional: well developed, well nourished, no distress HEENT: normocephalic, lesion on external nose, no abnormal borders or erythema present  CV: normal rate Pulm/chest wall: normal effort Breast Exam: deferred Abdomen: soft Neuro: alert and oriented x 3 Skin: warm, dry Psych: affect normal Pelvic exam: Cervix pink, visually closed, without lesion, scant white creamy discharge, vaginal walls and external genitalia normal  Assessment & Plan:  1. Encounter for routine checking of intrauterine contraceptive device (IUD) (Primary)    Albertine Grates, FNP

## 2024-04-07 ENCOUNTER — Emergency Department (HOSPITAL_COMMUNITY)

## 2024-04-07 ENCOUNTER — Other Ambulatory Visit: Payer: Self-pay

## 2024-04-07 ENCOUNTER — Encounter (HOSPITAL_COMMUNITY): Payer: Self-pay

## 2024-04-07 ENCOUNTER — Emergency Department (HOSPITAL_COMMUNITY)
Admission: EM | Admit: 2024-04-07 | Discharge: 2024-04-08 | Disposition: A | Discharge status: Home / Self Care | Attending: Emergency Medicine | Admitting: Emergency Medicine

## 2024-04-07 DIAGNOSIS — T17308A Unspecified foreign body in larynx causing other injury, initial encounter: Secondary | ICD-10-CM

## 2024-04-07 DIAGNOSIS — R9431 Abnormal electrocardiogram [ECG] [EKG]: Secondary | ICD-10-CM | POA: Diagnosis not present

## 2024-04-07 DIAGNOSIS — R0989 Other specified symptoms and signs involving the circulatory and respiratory systems: Secondary | ICD-10-CM | POA: Diagnosis not present

## 2024-04-07 DIAGNOSIS — R09A0 Foreign body sensation, unspecified: Secondary | ICD-10-CM | POA: Diagnosis not present

## 2024-04-07 DIAGNOSIS — R0602 Shortness of breath: Secondary | ICD-10-CM | POA: Diagnosis not present

## 2024-04-07 NOTE — ED Notes (Signed)
 Pt ambulated in hall without issue, oxygen 98-100% on RA. Pt drank entire cup of water without complaint.

## 2024-04-07 NOTE — ED Provider Notes (Signed)
  [place] EMERGENCY DEPARTMENT AT [facility] Provider Note   CSN: [id number] Arrival date & time: 04/07/24  1948     Patient presents with: Choking   Erin Avery is a 38 y.o. female.  {Add pertinent medical, surgical, social history, OB history to HPI:32947} HPI     Prior to Admission medications   Medication Sig Start Date End Date Taking? Authorizing Provider  Ferrous Fumarate  150 MG TABS Take 1 tablet (150 mg total) by mouth every other day. Patient not taking: Reported on 08/31/2023 07/15/23   Loreli Suzen BIRCH, CNM  ibuprofen  (ADVIL ) 600 MG tablet Take 1 tablet (600 mg total) by mouth every 6 (six) hours as needed. 07/15/23   Loreli Suzen BIRCH, CNM  Prenatal Vit-Fe Fumarate-FA (MULTIVITAMIN-PRENATAL) 27-0.8 MG TABS tablet Take 1 tablet by mouth daily at 12 noon.    [provider]    Allergies: Patient has no known allergies.    Review of Systems  Updated Vital Signs BP (!) 137/91 (BP Location: Right Arm)   Pulse 100   Temp 98.5 F (36.9 C) (Oral)   Resp 18   Ht 5' 1 (1.549 m)   Wt 72.6 kg   SpO2 100%   Breastfeeding Yes   BMI 30.23 kg/m   Physical Exam  (all labs ordered are listed, but only abnormal results are displayed) Labs Reviewed - No data to display  EKG: None  Radiology: No results found.  {Document cardiac monitor, telemetry assessment procedure when appropriate:32947} Procedures   Medications Ordered in the ED - No data to display    {Click here for ABCD2, HEART and other calculators REFRESH Note before signing:1}                              Medical Decision Making  ***  {Document critical care time when appropriate  Document review of labs and clinical decision tools ie CHADS2VASC2, etc  Document your independent review of radiology images and any outside records  Document your discussion with family members, caretakers and with consultants  Document social determinants of health affecting pt's care   Document your decision making why or why not admission, treatments were needed:32947:::1}   Final diagnoses:  None    ED Discharge Orders     None

## 2024-04-07 NOTE — Discharge Instructions (Signed)
 Thank you for allowing us  to care for you today.  Please be sure to return to an emergency department if you experience worsening pain, chest pain, shortness of breath, voice changes, difficulty swallowing, passout or believe you need emergent medical care.  Please be sure to follow-up with your primary care provider to ensure complete resolution of symptoms and no recurrence.

## 2024-04-07 NOTE — ED Triage Notes (Signed)
 Pt was eating a carrot when she began choking. Pt was unable to talk. Pt is currently maintaining her airway but feels like she can not take a breath. Pt reports pain with breathing.

## 2024-04-07 NOTE — ED Notes (Signed)
Urine in the Lab

## 2024-04-07 NOTE — ED Provider Notes (Incomplete)
 [place] EMERGENCY DEPARTMENT AT [facility] Provider Note   CSN: [id number] Arrival date & time: 04/07/24  1948     Patient presents with: Choking   Erin Avery is a 38 y.o. female past medical history significant for gestational diabetes who presents to the emergency department for a choking episode.  Patient is companied by her husband and daughter who help provide medical history.  Patient states that she was eating dinner at approximately 6:30 PM when she began choking on a carrot.  Patient was having difficulty breathing and speaking therefore she presented to the emergency department.  Upon initial arrival to the emergency department patient had continued difficulty breathing and speaking with concern for bolus.  On my initial evaluation of the patient she states that her symptoms are much improved, she is able to speak and denies difficulty breathing, swallowing and denies chest pain or shortness of breath at this time.  Patient does state that she continues to have a food bolus sensation in her throat however this has improved.  {Add pertinent medical, surgical, social history, OB history to HPI:32947} HPI     Prior to Admission medications   Not on File    Allergies: Patient has no known allergies.    Review of Systems  Updated Vital Signs BP 111/75   Pulse 97   Temp 98.5 F (36.9 C) (Oral)   Resp 17   Ht 5' 1 (1.549 m)   Wt 72.6 kg   SpO2 100%   Breastfeeding Yes   BMI 30.23 kg/m   Physical Exam Vitals and nursing note reviewed.  Constitutional:      General: She is not in acute distress.    Appearance: She is not ill-appearing.  HENT:     Head: Normocephalic and atraumatic.     Mouth/Throat:     Pharynx: Oropharynx is clear. No pharyngeal swelling, posterior oropharyngeal erythema or postnasal drip.     Tonsils: No tonsillar exudate or tonsillar abscesses.     Comments: No obvious foreign body within oropharynx Neck:     Vascular: No  JVD.     Trachea: Trachea normal. No tracheal tenderness or tracheal deviation.  Cardiovascular:     Rate and Rhythm: Normal rate and regular rhythm.  Pulmonary:     Effort: Pulmonary effort is normal. No tachypnea, prolonged expiration or respiratory distress.     Breath sounds: Normal breath sounds. No stridor or decreased air movement. No wheezing, rhonchi or rales.  Chest:     Chest wall: No tenderness.  Abdominal:     General: There is no distension.     Palpations: Abdomen is soft.     Tenderness: There is no abdominal tenderness.  Musculoskeletal:     Cervical back: Full passive range of motion without pain and normal range of motion.     Right lower leg: No edema.     Left lower leg: No edema.  Lymphadenopathy:     Cervical: No cervical adenopathy.  Neurological:     Mental Status: She is alert and oriented to person, place, and time.  Psychiatric:        Behavior: Behavior is cooperative.     (all labs ordered are listed, but only abnormal results are displayed) Labs Reviewed - No data to display  EKG: EKG Interpretation Date/Time:  Sunday April 07 2024 20:01:24 EDT Ventricular Rate:  98 PR Interval:  166 QRS Duration:  78 QT Interval:  340 QTC Calculation: 434 R Axis:  40  Text Interpretation: Normal sinus rhythm Nonspecific T wave abnormality Abnormal ECG No previous ECGs available Confirmed by Trine Likes [id number]) on 04/07/2024 11:30:16 PM  Radiology: DG Chest Portable 1 View Result Date: 04/07/2024 CLINICAL DATA:  Foreign body sensation EXAM: PORTABLE CHEST 1 VIEW COMPARISON:  None Available. FINDINGS: The heart size and mediastinal contours are within normal limits. Both lungs are clear. The visualized skeletal structures are unremarkable. IMPRESSION: No active disease. Electronically Signed   By: Oneil Devonshire M.D.   On: 04/07/2024 21:58    {Document cardiac monitor, telemetry assessment procedure when appropriate:32947} Procedures   Medications  Ordered in the ED - No data to display  Clinical Course as of 04/08/24 0001  Sun Apr 07, 2024  2242 CXR reviewed by me: trachea is midline. No pneumothorax, no hemothorax. No cardiomegaly, focal consolidation and no pulmonary edema. No obvious foreign body.  [AG]    Clinical Course User Index [AG] Nada Chroman, DO   {Click here for ABCD2, HEART and other calculators REFRESH Note before signing:1}                              Medical Decision Making Amount and/or Complexity of Data Reviewed Radiology: ordered.   ***  {Document critical care time when appropriate  Document review of labs and clinical decision tools ie CHADS2VASC2, etc  Document your independent review of radiology images and any outside records  Document your discussion with family members, caretakers and with consultants  Document social determinants of health affecting pt's care  Document your decision making why or why not admission, treatments were needed:32947:::1}   Final diagnoses:  Choking, initial encounter    ED Discharge Orders     None

## 2024-04-08 NOTE — ED Notes (Addendum)
 Family requested to speak to charge RN regarding the wait time for the pt who was choking.  RN went and spoke to family after reviewing the chart.  RN was aware of pt as the triage RN had called about the pt earlier stating family wanted to speak to charge RN. RN explained that she was involved w/ a critical pt and would put her name next to be brought back. Pt reviewed clinical assessment w/ triage RN, pt was crying and able to speak in complete sentences and vitals were stable.  Family was not happy w/ this. Pt indicated she was crying not b/c she was having trouble breathing but b/c she was thinking about her 30 month old baby and afraid she was dying.  She was brought back as soon as possible when the next room opened up.  RN apologized for the wait.

## 2024-04-08 NOTE — ED Notes (Signed)
 Rn saw pt in the lobby pt able to maintain airway. Pt family is upset the pt has not been brought back to a room.

## 2024-04-09 ENCOUNTER — Ambulatory Visit: Admitting: Dermatology

## 2024-04-09 ENCOUNTER — Encounter: Payer: Self-pay | Admitting: Dermatology

## 2024-04-09 VITALS — BP 121/80 | HR 72

## 2024-04-09 DIAGNOSIS — L84 Corns and callosities: Secondary | ICD-10-CM

## 2024-04-09 DIAGNOSIS — D492 Neoplasm of unspecified behavior of bone, soft tissue, and skin: Secondary | ICD-10-CM

## 2024-04-09 DIAGNOSIS — D2239 Melanocytic nevi of other parts of face: Secondary | ICD-10-CM | POA: Diagnosis not present

## 2024-04-09 DIAGNOSIS — D485 Neoplasm of uncertain behavior of skin: Secondary | ICD-10-CM

## 2024-04-09 NOTE — Progress Notes (Unsigned)
   Follow-Up Visit   Subjective  Erin Avery is a 38 y.o. female who presents for the following: Mole  Patient states she has mole located at the nose that she would like to have examined. Patient reports the areas have been there for several years. She reports the areas are not bothersome. Patient feels it has gotten bigger in size. Patient reports she has not previously been treated for these areas. Patient denies Hx of bx. Patient denies family history of skin cancer(s). She isn't currently pregnant of trying to conceive.   The following portions of the chart were reviewed this encounter and updated as appropriate: medications, allergies, medical history  Review of Systems:  No other skin or systemic complaints except as noted in HPI or Assessment and Plan.  Objective  Well appearing patient in no apparent distress; mood and affect are within normal limits.  A focused examination was performed of the following areas: Nose  Relevant exam findings are noted in the Assessment and Plan.    Left Alar Crease 3 mm Irregular Brown Macule  Assessment & Plan   1. Suspicious Mole with Recent Growth - Assessment: Patient presents with a 3-millimeter dark brown papule located on the left of the anal crease. The mole has been present for a long time but has recently started growing, raising concern for potential dysplastic nevus. No bleeding reported. No family history of skin cancer. Upon examination with magnification, the mole appeared concerning due to recent changes. - Plan:    Performed shave biopsy to rule out dysplastic nevus    Send biopsy specimen to lab for microscopic examination    Apply healing cream (Aquaphor) to biopsy site daily    Keep biopsy site clean with soap and water, avoid scrubbing    Apply ointment morning and night    Review lab results when available (expected in 1.5 weeks)    Communicate results to patient via patient portal  2. Callus on Toe with Deep  Growth - Assessment: Patient reports a callus on the toe that continues to grow deeply. This is likely due to friction from walking patterns and shoe wear. - Plan:    Recommend over-the-counter callus remover for regular use  Follow-up in 1.5 weeks to review biopsy results.  NEOPLASM OF UNCERTAIN BEHAVIOR OF SKIN Left Alar Crease Epidermal / dermal shaving  Lesion diameter (cm):  0.3 Informed consent: discussed and consent obtained   Timeout: patient name, date of birth, surgical site, and procedure verified   Procedure prep:  Patient was prepped and draped in usual sterile fashion Prep type:  Isopropyl alcohol Anesthesia: the lesion was anesthetized in a standard fashion   Anesthetic:  1% lidocaine  w/ epinephrine  1-100,000 buffered w/ 8.4% NaHCO3 Instrument used: flexible razor blade   Hemostasis achieved with: pressure, aluminum chloride and electrodesiccation   Outcome: patient tolerated procedure well   Post-procedure details: sterile dressing applied and wound care instructions given   Dressing type: bandage and petrolatum    Specimen 1 - Surgical pathology Differential Diagnosis: R/O DN VS OTHER  Check Margins: No CALLUS    No follow-ups on file.   Documentation: I have reviewed the above documentation for accuracy and completeness, and I agree with the above.  Delon Lenis, DO

## 2024-04-09 NOTE — Patient Instructions (Addendum)

## 2024-04-09 NOTE — Progress Notes (Deleted)
   New Patient Visit   Subjective  Erin Avery is a 38 y.o. female who presents for the following: Mole  Patient states she has mole located at the nose that she would like to have examined. Patient reports the areas have been there for several years. She reports the areas are not bothersome. Patient feels it has gotten bigger in size. Patient reports she has not previously been treated for these areas. Patient denies Hx of bx. Patient denies family history of skin cancer(s). She isn't currently pregnant of trying to conceive.   The patient has spots, moles and lesions to be evaluated, some may be new or changing and the patient may have concern these could be cancer.  The following portions of the chart were reviewed this encounter and updated as appropriate: medications, allergies, medical history  Review of Systems:  No other skin or systemic complaints except as noted in HPI or Assessment and Plan.  Objective  Well appearing patient in no apparent distress; mood and affect are within normal limits.  A focused examination was performed of the following areas: Nose  Relevant exam findings are noted in the Assessment and Plan.        Assessment & Plan       No follow-ups on file.  I, Amanii Snethen, am acting as Neurosurgeon for [employer], DO.  Documentation: I have reviewed the above documentation for accuracy and completeness, and I agree with the above.  Delon Lenis, DO

## 2024-04-10 LAB — SURGICAL PATHOLOGY

## 2024-04-11 ENCOUNTER — Ambulatory Visit: Payer: Self-pay | Admitting: Dermatology

## 2024-04-15 NOTE — Progress Notes (Unsigned)
    SUBJECTIVE:   CHIEF COMPLAINT / HPI:   back pain Vaginal delivery with 3rd degree laceration 07/2023, had epidural  PERTINENT  PMH / PSH: n/a  OBJECTIVE:   LMP 04/01/2024   ***  ASSESSMENT/PLAN:   Assessment & Plan      Elyce Prescott, DO [facility]

## 2024-04-16 ENCOUNTER — Ambulatory Visit: Payer: Self-pay | Admitting: Family Medicine

## 2024-04-16 ENCOUNTER — Encounter: Payer: Self-pay | Admitting: Family Medicine

## 2024-04-16 ENCOUNTER — Ambulatory Visit (INDEPENDENT_AMBULATORY_CARE_PROVIDER_SITE_OTHER): Admitting: Family Medicine

## 2024-04-16 VITALS — BP 112/76 | HR 80 | Wt 151.0 lb

## 2024-04-16 DIAGNOSIS — R32 Unspecified urinary incontinence: Secondary | ICD-10-CM | POA: Diagnosis not present

## 2024-04-16 DIAGNOSIS — Z23 Encounter for immunization: Secondary | ICD-10-CM | POA: Diagnosis not present

## 2024-04-16 DIAGNOSIS — R159 Full incontinence of feces: Secondary | ICD-10-CM

## 2024-04-16 DIAGNOSIS — O24419 Gestational diabetes mellitus in pregnancy, unspecified control: Secondary | ICD-10-CM | POA: Diagnosis not present

## 2024-04-16 DIAGNOSIS — K219 Gastro-esophageal reflux disease without esophagitis: Secondary | ICD-10-CM | POA: Diagnosis not present

## 2024-04-16 DIAGNOSIS — L84 Corns and callosities: Secondary | ICD-10-CM

## 2024-04-16 LAB — POCT GLYCOSYLATED HEMOGLOBIN (HGB A1C): HbA1c, POC (prediabetic range): 5.7 % (ref 5.7–6.4)

## 2024-04-16 MED ORDER — FAMOTIDINE 20 MG PO TABS: 20.0000 mg | ORAL_TABLET | Freq: Two times a day (BID) | ORAL | 0 refills | Status: DC

## 2024-04-16 MED ORDER — UREA 10 % EX CREA: TOPICAL_CREAM | CUTANEOUS | 0 refills | Status: AC | PRN

## 2024-04-16 NOTE — Patient Instructions (Signed)
 Good to see you today - Thank you for coming in  Things we discussed today: We are checking your A1c today since you had gestational diabetes, I will let you know if this is abnormal  I have referred you to urogynecology and pelvic floor therapy  Please take Pepcid  for heartburn and apply urea  cream to your callus Follow-up with your PCP in 2 months

## 2024-04-17 ENCOUNTER — Encounter: Payer: Self-pay | Admitting: Physical Therapy

## 2024-04-17 ENCOUNTER — Ambulatory Visit: Attending: Family Medicine | Admitting: Physical Therapy

## 2024-04-17 ENCOUNTER — Other Ambulatory Visit: Payer: Self-pay

## 2024-04-17 DIAGNOSIS — R293 Abnormal posture: Secondary | ICD-10-CM | POA: Insufficient documentation

## 2024-04-17 DIAGNOSIS — R159 Full incontinence of feces: Secondary | ICD-10-CM | POA: Diagnosis not present

## 2024-04-17 DIAGNOSIS — R279 Unspecified lack of coordination: Secondary | ICD-10-CM | POA: Diagnosis present

## 2024-04-17 DIAGNOSIS — M6281 Muscle weakness (generalized): Secondary | ICD-10-CM | POA: Insufficient documentation

## 2024-04-17 NOTE — Therapy (Signed)
 OUTPATIENT PHYSICAL THERAPY FEMALE PELVIC EVALUATION   Patient Name: Erin Avery MRN: [id number] DOB:[birth year], 38 y.o., female Today's Date: 04/17/2024  END OF SESSION:  PT End of Session - 04/17/24 0925     Visit Number 1    Date for PT Re-Evaluation 10/15/24    Authorization Type healthy blue    Authorization Time Period awating auth    PT Start Time 0930    PT Stop Time 1000    PT Time Calculation (min) 30 min          Past Medical History:  Diagnosis Date   Complication of anesthesia    had severe back pain with last epidural   GERD (gastroesophageal reflux disease)    Gestational diabetes    History of chorioamnionitis 07/13/2023   Missed abortion 11/15/2021   Past Surgical History:  Procedure Laterality Date   CESAREAN SECTION     DILATION AND EVACUATION N/A 11/24/2021   Procedure: DILATATION AND EVACUATION;  Surgeon: Zina Jerilynn LABOR, MD;  Location: [facility] OR;  Service: Gynecology;  Laterality: N/A;   Patient Active Problem List   Diagnosis Date Noted   IUD (intrauterine device) in place 10/02/2023   History of chorioamnionitis 07/13/2023   H/O: C-section 07/13/2023   History of gestational diabetes 05/18/2023   Rash 04/10/2023   History of cesarean delivery 12/09/2022    PCP: Nicholas Bar, MD   REFERRING PROVIDER: McDiarmid, Krystal BIRCH, MD  REFERRING DIAG: R15.9 (ICD-10-CM) - Incontinence of feces, unspecified fecal incontinence type  THERAPY DIAG:  Muscle weakness (generalized)  Abnormal posture  Unspecified lack of coordination  Rationale for Evaluation and Treatment: Rehabilitation  ONSET DATE: 9 months   SUBJECTIVE:                                                                                                                                                                                           SUBJECTIVE STATEMENT: Has fecal and urine incontinence with stress and urge (small window of urge to small loss of both). Also has central to  Lt low back pain since vaginal birth. Has baby 9 months ago vaginally and had 3rd tearing.  First baby c-section, second vaginal. Had a little back pain after first baby but now much worse.   Fluid intake:   PAIN:  Are you having pain? Yes NPRS scale: 5-7/10 Pain location: low back   Pain type: aching Pain description: constant   Aggravating factors: bending, sitting at work longer 15 mins, transfers, carrying baby (19#) Relieving factors: tylenol , heat, massage  PRECAUTIONS: Other: postpartum   RED FLAGS: None   WEIGHT BEARING RESTRICTIONS: No  FALLS:  Has patient fallen in last 6 months? No  OCCUPATION: threading brows (8 hours, 3 days)  ACTIVITY LEVEL : moderate   PLOF: Independent  PATIENT GOALS: to have less pain and less leakage to complete work day and child care  PERTINENT HISTORY:  X1 c-section, x1 vaginal birth with 3rd deg tearing Sexual abuse: No  BOWEL MOVEMENT: Pain with bowel movement: No Type of bowel movement:Type (Bristol Stool Scale) 4, Frequency daily, and Strain no Fully empty rectum: Yes:   Leakage: Yes:   Pads: Yes: liner  Fiber supplement/laxative No  URINATION: Pain with urination: No Fully empty bladder: Yes:   Stream: Strong Urgency: Yes  Frequency: not quicker than every 2 hours, 2-3x Leakage: Urge to void, Coughing, Sneezing, Exercise, and Lifting Pads: Yes: liner  INTERCOURSE:  Ability to have vaginal penetration Yes  Pain with intercourse: none DrynessNo Climax: not painful Marinoff Scale: 0/3  PREGNANCY: Vaginal deliveries 1 Tearing 3rd degree  C-section deliveries 1 Currently pregnant No  PROLAPSE: None   OBJECTIVE:  Note: Objective measures were completed at Evaluation unless otherwise noted.  DIAGNOSTIC FINDINGS:    PATIENT SURVEYS:    PFIQ-7: 205  COGNITION: Overall cognitive status: Within functional limits for tasks assessed     SENSATION: Light touch: Appears intact  LUMBAR SPECIAL TESTS:   Single leg stance test: pelvic rotation bil and hip drop bil  FUNCTIONAL TESTS:  Functional squat - bil knee valgus; sit up test 1/3   GAIT: WFL  POSTURE: rounded shoulders, forward head, increased thoracic kyphosis, and anterior pelvic tilt   LUMBARAROM/PROM:  A/PROM A/PROM  eval  Flexion Limited by 25%  Extension WFL  Right lateral flexion Limited by 25%  Left lateral flexion Limited by 25%  Right rotation Limited by 25%  Left rotation Limited by 25%   (Blank rows = not tested)  LOWER EXTREMITY ROM:  Bil hamstrings, adductors, hip ER and hip IR Limited by 25%  LOWER EXTREMITY MMT:  Bil hips grossly 3+/5 PALPATION:   General: TTP at bil piriformis, glute meds, and   Pelvic Alignment: WFL  Abdominal: tightness in lower abdominal quadrants                External Perineal Exam: TTP at perineal body                             Internal Pelvic Floor: TTP bil obturators, superficial layer bil  Patient confirms identification and approves PT to assess internal pelvic floor and treatment Yes No emotional/communication barriers or cognitive limitation. Patient is motivated to learn. Patient understands and agrees with treatment goals and plan. PT explains patient will be examined in standing, sitting, and lying down to see how their muscles and joints work. When they are ready, they will be asked to remove their underwear so PT can examine their perineum. The patient is also given the option of providing their own chaperone as one is not provided in our facility. The patient also has the right and is explained the right to defer or refuse any part of the evaluation or treatment including the internal exam. With the patient's consent, PT will use one gloved finger to gently assess the muscles of the pelvic floor, seeing how well it contracts and relaxes and if there is muscle symmetry. After, the patient will get dressed and PT and patient will discuss exam findings and plan of  care. PT and patient discuss plan of care,  schedule, attendance policy and HEP activities.  PELVIC MMT:   MMT eval  Vaginal 2/5, 4s, 5 reps  Internal Anal Sphincter   External Anal Sphincter   Puborectalis   Diastasis Recti 1-1.5 finger width, decreased density   (Blank rows = not tested)        TONE: Decreased   PROLAPSE: Not seen in hooklying with cough  TODAY'S TREATMENT:                                                                                                                              DATE:   04/17/24 EVAL Examination completed, findings reviewed, pt educated on POC, HEP. Pt motivated to participate in PT and agreeable to attempt recommendations.    If treatment provided at initial evaluation, no treatment charged due to lack of authorization.       PATIENT EDUCATION:  Education details: [email or web address] Person educated: Patient Education method: Explanation, Demonstration, Tactile cues, Verbal cues, and Handouts Education comprehension: verbalized understanding, returned demonstration, verbal cues required, tactile cues required, and needs further education  HOME EXERCISE PROGRAM: Access Code: [email or web address] URL: [email or web address] Date: 04/17/2024 Prepared by: [facility] - Outpatient Rehab - [facility]  Exercises - Supine Diaphragmatic Breathing  - 1 x daily - 7 x weekly - 2 sets - 10 reps - Supine Pelvic Floor Contraction  - 1 x daily - 7 x weekly - 2 sets - 10 reps - Supine Hip Internal and External Rotation  - 1 x daily - 7 x weekly - 1 sets - 10 reps - Supine Butterfly Groin Stretch  - 1 x daily - 7 x weekly - 3 sets - 30s hold - Diaphragmatic Breathing in Child's Pose with Pelvic Floor Relaxation  - 1 x daily - 7 x weekly - 3 sets - 30s hold - Seated Happy Baby With Trunk Flexion For Pelvic Relaxation  - 1 x daily - 7 x weekly - 1 sets - 3 reps - 30s holds  ASSESSMENT:  CLINICAL IMPRESSION: Patient is a 38 y.o. female  who was seen  today for physical therapy evaluation and treatment for postpartum back pain, pelvic pain, fecal incontinence, urinary incontinence with stressors and urgency. Pt limited in work day with needing to rush to bathroom often, gets up up to 3x nightly to urinate,  has back pain with lifting children, sitting longer than 10-15 mins, and bending. 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 Pt found to have decreased core and hip strength, DRA separation, impaired posture and chest breathing pattern, and pelvic instability. Patient consented to internal pelvic floor assessment vaginally this date and found to have decreased strength, endurance, and coordination and TTP at perineal body and deep layers. Pt would benefit from additional PT to further address deficits.    OBJECTIVE IMPAIRMENTS: decreased activity tolerance, decreased coordination, decreased endurance, decreased mobility, decreased strength, increased fascial restrictions, increased muscle spasms, impaired flexibility, improper body mechanics, postural  dysfunction, and pain.   ACTIVITY LIMITATIONS: carrying, lifting, bending, sitting, transfers, continence, locomotion level, and caring for others  PARTICIPATION LIMITATIONS: cleaning, shopping, community activity, occupation, and yard work  PERSONAL FACTORS: Fitness, Past/current experiences, Time since onset of injury/illness/exacerbation, and 1 comorbidity: medical history are also affecting patient's functional outcome.   REHAB POTENTIAL: Good  CLINICAL DECISION MAKING: Stable/uncomplicated  EVALUATION COMPLEXITY: Low   GOALS: Goals reviewed with patient? Yes  SHORT TERM GOALS: Target date: 05/15/24  Pt to be I with HEP for carry over and continuing recommendations for improved outcomes.   Baseline: Goal status: INITIAL  2.  Pt will be independent with the knack, urge suppression technique, and double voiding in order to improve bladder habits and decrease urinary  incontinence.   Baseline:  Goal status: INITIAL  3.  Pt will be independent with use of squatty potty, relaxed toileting mechanics, and improved bowel movement techniques in order to increase ease of bowel movements and complete evacuation.   Baseline:  Goal status: INITIAL  4.  Pt will be able to correctly perform diaphragmatic breathing and appropriate pressure management in order to  improve pelvic floor A/ROM.   Baseline:  Goal status: INITIAL  5.  Pt to consistently demonstrate transverse abdominis activation activation for improved pelvic stability for improved tolerance to carrying children without pain.  Baseline:  Goal status: INITIAL   LONG TERM GOALS: Target date: 10/15/24  Pt to be I with advanced HEP for carry over and continuing recommendations for improved outcomes.   Baseline:  Goal status: INITIAL  2.  Pt will be able to functional actions such as working full shift without leakage due to improved pelvic floor strength and coordination.  Baseline:  Goal status: INITIAL  3.  Pt to demonstrate at least 5/5 bil hip strength for improved pelvic stability and functional squats without leakage.  Baseline:  Goal status: INITIAL  4.  Pt to demonstrate improved core and hip strength evident by ability to complete x10 single leg sit to stands without pain or compensation for improved tolerance to carrying and walking with children.  Baseline:  Goal status: INITIAL  5.  Pt to report ability to sit for at least 1 hour for ability to better tolerate road trip without pain.  Baseline:  Goal status: INITIAL  6.  Pt to demonstrate improved midline upright posture for at least 30 mins for decreased strain at pelvic floor.  Baseline:  Goal status: INITIAL  PLAN:  PT FREQUENCY: 1x/week  PT DURATION: 8 sessions  PLANNED INTERVENTIONS: 97110-Therapeutic exercises, 97530- Therapeutic activity, [id number]- Neuromuscular re-education, 97535- Self Care, [id number]- Manual therapy, [phone]-  Canalith repositioning, [id number]- Aquatic Therapy, [phone]- Electrical stimulation (manual), [phone] (1-2 muscles), [phone] (3+ muscles)- Dry Needling, Patient/Family education, Balance training, Taping, Joint mobilization, Spinal mobilization, Scar mobilization, DME instructions, Cryotherapy, Moist heat, and Biofeedback  PLAN FOR NEXT SESSION: core and hip strengthening, pelvic floor coordination, diaphragmatic breathing, stretching thoracic and lumbar spine, voiding mechanics.    Darryle Navy, PT, DPT 09/03/259:35 AM  [facility] [address], [address] [place], [place] [address] Phone # [phone] Fax [phone]

## 2024-05-01 ENCOUNTER — Ambulatory Visit: Admitting: Physical Therapy

## 2024-05-01 DIAGNOSIS — M6281 Muscle weakness (generalized): Secondary | ICD-10-CM

## 2024-05-01 DIAGNOSIS — R279 Unspecified lack of coordination: Secondary | ICD-10-CM

## 2024-05-01 DIAGNOSIS — R293 Abnormal posture: Secondary | ICD-10-CM

## 2024-05-01 NOTE — Therapy (Signed)
 OUTPATIENT PHYSICAL THERAPY FEMALE PELVIC EVALUATION   Patient Name: Erin Avery MRN: [id number] DOB:[birth year], 38 y.o., female Today's Date: 05/01/2024  END OF SESSION:  PT End of Session - 05/01/24 1618     Visit Number 2    Date for PT Re-Evaluation 10/15/24    Authorization Type healthy blue    Authorization Time Period CARELON APPROVED 10 VISITS 04/17/2024 - 07/15/2024    Authorization - Visit Number 1    Authorization - Number of Visits 10    PT Start Time 1615    PT Stop Time 1655    PT Time Calculation (min) 40 min    Activity Tolerance Patient tolerated treatment well    Behavior During Therapy [facility] for tasks assessed/performed          Past Medical History:  Diagnosis Date   Complication of anesthesia    had severe back pain with last epidural   GERD (gastroesophageal reflux disease)    Gestational diabetes    History of chorioamnionitis 07/13/2023   Missed abortion 11/15/2021   Past Surgical History:  Procedure Laterality Date   CESAREAN SECTION     DILATION AND EVACUATION N/A 11/24/2021   Procedure: DILATATION AND EVACUATION;  Surgeon: Zina Jerilynn LABOR, MD;  Location: [facility] OR;  Service: Gynecology;  Laterality: N/A;   Patient Active Problem List   Diagnosis Date Noted   IUD (intrauterine device) in place 10/02/2023   History of chorioamnionitis 07/13/2023   H/O: C-section 07/13/2023   History of gestational diabetes 05/18/2023   Rash 04/10/2023   History of cesarean delivery 12/09/2022    PCP: Nicholas Bar, MD   REFERRING PROVIDER: McDiarmid, Krystal BIRCH, MD  REFERRING DIAG: R15.9 (ICD-10-CM) - Incontinence of feces, unspecified fecal incontinence type  THERAPY DIAG:  Muscle weakness (generalized)  Abnormal posture  Unspecified lack of coordination  Rationale for Evaluation and Treatment: Rehabilitation  ONSET DATE: 9 months   SUBJECTIVE:                                                                                                                                                                                            SUBJECTIVE STATEMENT: Has fecal and urine incontinence with stress and urge (small window of urge to small loss of both). Also has central to Lt low back pain since vaginal birth. Has baby 9 months ago vaginally and had 3rd tearing.  First baby c-section, second vaginal. Had a little back pain after first baby but now much worse.    PAIN:  Are you having pain? Yes NPRS scale: 5/10 Pain location: low back   Pain type: aching Pain description:  constant   Aggravating factors: bending, sitting at work longer 15 mins, transfers, carrying baby (19#) Relieving factors: tylenol , heat, massage  PRECAUTIONS: Other: postpartum   RED FLAGS: None   WEIGHT BEARING RESTRICTIONS: No  FALLS:  Has patient fallen in last 6 months? No  OCCUPATION: threading brows (8 hours, 3 days)  ACTIVITY LEVEL : moderate   PLOF: Independent  PATIENT GOALS: to have less pain and less leakage to complete work day and child care  PERTINENT HISTORY:  X1 c-section, x1 vaginal birth with 3rd deg tearing Sexual abuse: No  BOWEL MOVEMENT: Pain with bowel movement: No Type of bowel movement:Type (Bristol Stool Scale) 4, Frequency daily, and Strain no Fully empty rectum: Yes:   Leakage: Yes:   Pads: Yes: liner  Fiber supplement/laxative No  URINATION: Pain with urination: No Fully empty bladder: Yes:   Stream: Strong Urgency: Yes  Frequency: not quicker than every 2 hours, 2-3x Leakage: Urge to void, Coughing, Sneezing, Exercise, and Lifting Pads: Yes: liner  INTERCOURSE:  Ability to have vaginal penetration Yes  Pain with intercourse: none DrynessNo Climax: not painful Marinoff Scale: 0/3  PREGNANCY: Vaginal deliveries 1 Tearing 3rd degree  C-section deliveries 1 Currently pregnant No  PROLAPSE: None   OBJECTIVE:  Note: Objective measures were completed at Evaluation unless otherwise  noted.  DIAGNOSTIC FINDINGS:    PATIENT SURVEYS:    PFIQ-7: 205  COGNITION: Overall cognitive status: Within functional limits for tasks assessed     SENSATION: Light touch: Appears intact  LUMBAR SPECIAL TESTS:  Single leg stance test: pelvic rotation bil and hip drop bil  FUNCTIONAL TESTS:  Functional squat - bil knee valgus; sit up test 1/3   GAIT: WFL  POSTURE: rounded shoulders, forward head, increased thoracic kyphosis, and anterior pelvic tilt   LUMBARAROM/PROM:  A/PROM A/PROM  eval  Flexion Limited by 25%  Extension WFL  Right lateral flexion Limited by 25%  Left lateral flexion Limited by 25%  Right rotation Limited by 25%  Left rotation Limited by 25%   (Blank rows = not tested)  LOWER EXTREMITY ROM:  Bil hamstrings, adductors, hip ER and hip IR Limited by 25%  LOWER EXTREMITY MMT:  Bil hips grossly 3+/5 PALPATION:   General: TTP at bil piriformis, glute meds, and   Pelvic Alignment: WFL  Abdominal: tightness in lower abdominal quadrants                External Perineal Exam: TTP at perineal body                             Internal Pelvic Floor: TTP bil obturators, superficial layer bil  Patient confirms identification and approves PT to assess internal pelvic floor and treatment Yes No emotional/communication barriers or cognitive limitation. Patient is motivated to learn. Patient understands and agrees with treatment goals and plan. PT explains patient will be examined in standing, sitting, and lying down to see how their muscles and joints work. When they are ready, they will be asked to remove their underwear so PT can examine their perineum. The patient is also given the option of providing their own chaperone as one is not provided in our facility. The patient also has the right and is explained the right to defer or refuse any part of the evaluation or treatment including the internal exam. With the patient's consent, PT will use one gloved  finger to gently assess the muscles of the pelvic  floor, seeing how well it contracts and relaxes and if there is muscle symmetry. After, the patient will get dressed and PT and patient will discuss exam findings and plan of care. PT and patient discuss plan of care, schedule, attendance policy and HEP activities.  PELVIC MMT:   MMT eval  Vaginal 2/5, 4s, 5 reps  Internal Anal Sphincter   External Anal Sphincter   Puborectalis   Diastasis Recti 1-1.5 finger width, decreased density   (Blank rows = not tested)        TONE: Decreased   PROLAPSE: Not seen in hooklying with cough  TODAY'S TREATMENT:                                                                                                                              DATE:   04/17/24 EVAL Examination completed, findings reviewed, pt educated on POC, HEP. Pt motivated to participate in PT and agreeable to attempt recommendations.    If treatment provided at initial evaluation, no treatment charged due to lack of authorization.      05/01/24: Diaphragmatic breathing x10 in supine Diaphragmatic breathing with pelvic floor activation with exhale, lengthening with inhale x15 Diaphragmatic breathing with pelvic floor activation 2x10 Hooklying ball squeezes 2x10 with pelvic floor activation and exhale Single knee to chest 3x30s each Windshield wipers x10 each Hooklying ball squeezes between hands with exhale for improved transverse abdominis activation - no back pain with this Clams in sidelying but increased hip pain changed to hip abduction in sidelying with ball press 2x10 no back pain  2x10 sit to stands with pelvic floor activation with exhale and pelvic floor activation Pt educated on urge drill and knack for improved urinary incontinence   PATIENT EDUCATION:  Education details: [email or web address] Person educated: Patient Education method: Programmer, multimedia, Demonstration, Actor cues, Verbal cues, and Handouts Education comprehension:  verbalized understanding, returned demonstration, verbal cues required, tactile cues required, and needs further education  HOME EXERCISE PROGRAM: Access Code: [email or web address] URL: [email or web address] Date: 04/17/2024 Prepared by: Darryle  Exercises - Supine Diaphragmatic Breathing  - 1 x daily - 7 x weekly - 2 sets - 10 reps - Supine Pelvic Floor Contraction  - 1 x daily - 7 x weekly - 2 sets - 10 reps - Supine Hip Internal and External Rotation  - 1 x daily - 7 x weekly - 1 sets - 10 reps - Supine Butterfly Groin Stretch  - 1 x daily - 7 x weekly - 3 sets - 30s hold - Diaphragmatic Breathing in Child's Pose with Pelvic Floor Relaxation  - 1 x daily - 7 x weekly - 3 sets - 30s hold - Seated Happy Baby With Trunk Flexion For Pelvic Relaxation  - 1 x daily - 7 x weekly - 1 sets - 3 reps - 30s holds  ASSESSMENT:  CLINICAL IMPRESSION: Patient is a 38 y.o. female  who was seen today for physical therapy treatment for postpartum back pain,  pelvic pain, fecal incontinence, urinary incontinence with stressors and urgency. Session focused on coordination of pelvic floor and breathing, coordination of transverse abdominis activation with breathing, and coordination of breathing with activity. Pt benefited from max cues for improvement of techniques for improved pressure management, decreased leakage, and increased strengthening of core and hips.Patient consented to internal pelvic floor assessment vaginally this date and found to have decreased strength, endurance, and coordination and TTP at perineal body and deep layers. Pt would benefit from additional PT to further address deficits.    OBJECTIVE IMPAIRMENTS: decreased activity tolerance, decreased coordination, decreased endurance, decreased mobility, decreased strength, increased fascial restrictions, increased muscle spasms, impaired flexibility, improper body mechanics, postural dysfunction, and pain.   ACTIVITY LIMITATIONS: carrying,  lifting, bending, sitting, transfers, continence, locomotion level, and caring for others  PARTICIPATION LIMITATIONS: cleaning, shopping, community activity, occupation, and yard work  PERSONAL FACTORS: Fitness, Past/current experiences, Time since onset of injury/illness/exacerbation, and 1 comorbidity: medical history are also affecting patient's functional outcome.   REHAB POTENTIAL: Good  CLINICAL DECISION MAKING: Stable/uncomplicated  EVALUATION COMPLEXITY: Low   GOALS: Goals reviewed with patient? Yes  SHORT TERM GOALS: Target date: 05/15/24  Pt to be I with HEP for carry over and continuing recommendations for improved outcomes.   Baseline: Goal status: INITIAL  2.  Pt will be independent with the knack, urge suppression technique, and double voiding in order to improve bladder habits and decrease urinary incontinence.   Baseline:  Goal status: INITIAL  3.  Pt will be independent with use of squatty potty, relaxed toileting mechanics, and improved bowel movement techniques in order to increase ease of bowel movements and complete evacuation.   Baseline:  Goal status: INITIAL  4.  Pt will be able to correctly perform diaphragmatic breathing and appropriate pressure management in order to  improve pelvic floor A/ROM.   Baseline:  Goal status: INITIAL  5.  Pt to consistently demonstrate transverse abdominis activation activation for improved pelvic stability for improved tolerance to carrying children without pain.  Baseline:  Goal status: INITIAL   LONG TERM GOALS: Target date: 10/15/24  Pt to be I with advanced HEP for carry over and continuing recommendations for improved outcomes.   Baseline:  Goal status: INITIAL  2.  Pt will be able to functional actions such as working full shift without leakage due to improved pelvic floor strength and coordination.  Baseline:  Goal status: INITIAL  3.  Pt to demonstrate at least 5/5 bil hip strength for improved pelvic  stability and functional squats without leakage.  Baseline:  Goal status: INITIAL  4.  Pt to demonstrate improved core and hip strength evident by ability to complete x10 single leg sit to stands without pain or compensation for improved tolerance to carrying and walking with children.  Baseline:  Goal status: INITIAL  5.  Pt to report ability to sit for at least 1 hour for ability to better tolerate road trip without pain.  Baseline:  Goal status: INITIAL  6.  Pt to demonstrate improved midline upright posture for at least 30 mins for decreased strain at pelvic floor.  Baseline:  Goal status: INITIAL  PLAN:  PT FREQUENCY: 1x/week  PT DURATION: 8 sessions  PLANNED INTERVENTIONS: 97110-Therapeutic exercises, 97530- Therapeutic activity, 97112- Neuromuscular re-education, 97535- Self Care, [id number]- Manual therapy, [phone]- Canalith repositioning, [id number]- Aquatic Therapy, [phone]- Electrical stimulation (manual), [phone] (1-2 muscles), [phone] (3+ muscles)- Dry Needling, Patient/Family education, Balance training, Taping, Joint mobilization, Spinal mobilization, Scar mobilization, DME instructions,  Cryotherapy, Moist heat, and Biofeedback  PLAN FOR NEXT SESSION: core and hip strengthening, pelvic floor coordination, diaphragmatic breathing, stretching thoracic and lumbar spine, voiding mechanics.    Darryle Navy, PT, DPT 09/17/259:05 PM  [facility] [address], [address] [place], [place] [address] Phone # [phone] Fax [phone]

## 2024-05-01 NOTE — Patient Instructions (Signed)
Urge Incontinence  Ideal urination frequency is every 2-4 wakeful hours, which equates to 5-8 times within a 24-hour period.   Urge incontinence is leakage that occurs when the bladder muscle contracts, creating a sudden need to go before getting to the bathroom.   Going too often when your bladder isn't actually full can disrupt the body's automatic signals to store and hold urine longer, which will increase urgency/frequency.  In this case, the bladder "is running the show" and strategies can be learned to retrain this pattern.   One should be able to control the first urge to urinate, at around 150mL.  The bladder can hold up to a "grande latte," or 400mL. To help you gain control, practice the Urge Drill below when urgency strikes.  This drill will help retrain your bladder signals and allow you to store and hold urine longer.  The overall goal is to stretch out your time between voids to reach a more manageable voiding schedule.    Practice your "quick flicks" often throughout the day (each waking hour) even when you don't need feel the urge to go.  This will help strengthen your pelvic floor muscles, making them more effective in controlling leakage.  Urge Drill  When you feel an urge to go, follow these steps to regain control: Stop what you are doing and be still Take one deep breath, directing your air into your abdomen Think an affirming thought, such as "I've got this." Do 5 quick flicks of your pelvic floor Walk with control to the bathroom to void, or delay voiding     THE KNACK  The Knack is a strategy you may use to help to reduce or prevent leakage or passing of urine, gas or feces during an activity that causes downward force on the pelvic floor muscles.    Activities that can cause downward pressure on the pelvic floor muscles include coughing, sneezing, laughing, bending, lifting, and transitioning from different body positions such as from laying down to sitting up and  sitting to standing.  To perform The Knack, consciously squeeze and lift your pelvic floor muscles to perform a strong, well-timed pelvic muscle contraction BEFORE AND DURING these activities above.  As your contraction gets more coordinated and your muscles get stronger, you will become more effective in controlling your experience of incontinence or gas passing during these activities.      

## 2024-05-13 ENCOUNTER — Other Ambulatory Visit: Payer: Self-pay | Admitting: Family Medicine

## 2024-05-30 ENCOUNTER — Telehealth: Payer: Self-pay | Admitting: *Deleted

## 2024-05-30 DIAGNOSIS — Z8632 Personal history of gestational diabetes: Secondary | ICD-10-CM

## 2024-05-30 NOTE — Progress Notes (Signed)
 Complex Care Management Note  Care Guide Note 05/30/2024 Name: [name] MRN: [id number] DOB: 1986/06/26  [name] is a 38 y.o. year old female who sees Nicholas Bar, MD for primary care. I reached out to [employer] by phone today to offer complex care management services.  Ms. Wigley was given information about Complex Care Management services today including:   The Complex Care Management services include support from the care team which includes your Nurse Care Manager, Clinical Social Worker, or Pharmacist.  The Complex Care Management team is here to help remove barriers to the health concerns and goals most important to you. Complex Care Management services are voluntary, and the patient may decline or stop services at any time by request to their care team member.   Complex Care Management Consent Status: Patient agreed to services and verbal consent obtained.   Follow up plan:  Telephone appointment with complex care management team member scheduled for:  06/14/2024  Encounter Outcome:  Patient Scheduled  Thedford Franks, CMA [place]  [facility], [facility] Guide Direct Dial : [phone]  Fax: [phone] Website: [email or web address]

## 2024-06-06 ENCOUNTER — Ambulatory Visit: Admitting: Physical Therapy

## 2024-06-12 ENCOUNTER — Ambulatory Visit: Attending: Family Medicine | Admitting: Physical Therapy

## 2024-06-12 DIAGNOSIS — M6281 Muscle weakness (generalized): Secondary | ICD-10-CM | POA: Insufficient documentation

## 2024-06-12 DIAGNOSIS — R293 Abnormal posture: Secondary | ICD-10-CM | POA: Insufficient documentation

## 2024-06-12 DIAGNOSIS — R279 Unspecified lack of coordination: Secondary | ICD-10-CM | POA: Diagnosis not present

## 2024-06-12 NOTE — Therapy (Signed)
 OUTPATIENT PHYSICAL THERAPY FEMALE PELVIC TREATMENT   Patient Name: Erin Avery MRN: [id number] DOB:1985-10-17, 38 y.o., female Today's Date: 06/12/2024  END OF SESSION:  PT End of Session - 06/12/24 1237     Visit Number 3    Date for Recertification  10/15/24    Authorization Type healthy blue    Authorization Time Period CARELON APPROVED 10 VISITS 04/17/2024 - 07/15/2024    Authorization - Visit Number 2    Authorization - Number of Visits 10    PT Start Time 1232    PT Stop Time 1313    PT Time Calculation (min) 41 min    Activity Tolerance Patient tolerated treatment well    Behavior During Therapy [facility] for tasks assessed/performed           Past Medical History:  Diagnosis Date   Complication of anesthesia    had severe back pain with last epidural   GERD (gastroesophageal reflux disease)    Gestational diabetes    History of chorioamnionitis 07/13/2023   Missed abortion 11/15/2021   Past Surgical History:  Procedure Laterality Date   CESAREAN SECTION     DILATION AND EVACUATION N/A 11/24/2021   Procedure: DILATATION AND EVACUATION;  Surgeon: Zina Jerilynn LABOR, MD;  Location: [facility] OR;  Service: Gynecology;  Laterality: N/A;   Patient Active Problem List   Diagnosis Date Noted   IUD (intrauterine device) in place 10/02/2023   History of chorioamnionitis 07/13/2023   H/O: C-section 07/13/2023   History of gestational diabetes 05/18/2023   Rash 04/10/2023   History of cesarean delivery 12/09/2022    PCP: Nicholas Bar, MD   REFERRING PROVIDER: McDiarmid, Krystal BIRCH, MD  REFERRING DIAG: R15.9 (ICD-10-CM) - Incontinence of feces, unspecified fecal incontinence type  THERAPY DIAG:  Muscle weakness (generalized)  Abnormal posture  Unspecified lack of coordination  Rationale for Evaluation and Treatment: Rehabilitation  ONSET DATE: 9 months   SUBJECTIVE:                                                                                                                                                                                            SUBJECTIVE STATEMENT: Still having fecal and urine incontinence with stress and urge (small window of urge to small loss of both). Urgency with stool is still having leakage and looser stool with leakage. Urinary incontinence still happening with sneezing only, rest has gotten better.   Back pain still present.  Has been sick recently and hasn't done HEP in the last few day but was prior to that.  Urge drill has been doing great and able to make it dry  now.    PAIN:  Are you having pain? Yes NPRS scale: 5/10 Pain location: low back   Pain type: aching Pain description: constant   Aggravating factors: bending, sitting at work longer 15 mins, transfers, carrying baby (19#) Relieving factors: tylenol , heat, massage  PRECAUTIONS: Other: postpartum   RED FLAGS: None   WEIGHT BEARING RESTRICTIONS: No  FALLS:  Has patient fallen in last 6 months? No  OCCUPATION: threading brows (8 hours, 3 days)  ACTIVITY LEVEL : moderate   PLOF: Independent  PATIENT GOALS: to have less pain and less leakage to complete work day and child care  PERTINENT HISTORY:  X1 c-section, x1 vaginal birth with 3rd deg tearing Sexual abuse: No  BOWEL MOVEMENT: Pain with bowel movement: No Type of bowel movement:Type (Bristol Stool Scale) 4, Frequency daily, and Strain no Fully empty rectum: Yes:   Leakage: Yes:   Pads: Yes: liner  Fiber supplement/laxative No  URINATION: Pain with urination: No Fully empty bladder: Yes:   Stream: Strong Urgency: Yes  Frequency: not quicker than every 2 hours, 2-3x Leakage: Urge to void, Coughing, Sneezing, Exercise, and Lifting Pads: Yes: liner  INTERCOURSE:  Ability to have vaginal penetration Yes  Pain with intercourse: none DrynessNo Climax: not painful Marinoff Scale: 0/3  PREGNANCY: Vaginal deliveries 1 Tearing 3rd degree  C-section deliveries 1 Currently  pregnant No  PROLAPSE: None   OBJECTIVE:  Note: Objective measures were completed at Evaluation unless otherwise noted.  DIAGNOSTIC FINDINGS:    PATIENT SURVEYS:    PFIQ-7: 205  COGNITION: Overall cognitive status: Within functional limits for tasks assessed     SENSATION: Light touch: Appears intact  LUMBAR SPECIAL TESTS:  Single leg stance test: pelvic rotation bil and hip drop bil  FUNCTIONAL TESTS:  Functional squat - bil knee valgus; sit up test 1/3   GAIT: WFL  POSTURE: rounded shoulders, forward head, increased thoracic kyphosis, and anterior pelvic tilt   LUMBARAROM/PROM:  A/PROM A/PROM  eval  Flexion Limited by 25%  Extension WFL  Right lateral flexion Limited by 25%  Left lateral flexion Limited by 25%  Right rotation Limited by 25%  Left rotation Limited by 25%   (Blank rows = not tested)  LOWER EXTREMITY ROM:  Bil hamstrings, adductors, hip ER and hip IR Limited by 25%  LOWER EXTREMITY MMT:  Bil hips grossly 3+/5 PALPATION:   General: TTP at bil piriformis, glute meds, and   Pelvic Alignment: WFL  Abdominal: tightness in lower abdominal quadrants                External Perineal Exam: TTP at perineal body                             Internal Pelvic Floor: TTP bil obturators, superficial layer bil  Patient confirms identification and approves PT to assess internal pelvic floor and treatment Yes No emotional/communication barriers or cognitive limitation. Patient is motivated to learn. Patient understands and agrees with treatment goals and plan. PT explains patient will be examined in standing, sitting, and lying down to see how their muscles and joints work. When they are ready, they will be asked to remove their underwear so PT can examine their perineum. The patient is also given the option of providing their own chaperone as one is not provided in our facility. The patient also has the right and is explained the right to defer or refuse  any part of the  evaluation or treatment including the internal exam. With the patient's consent, PT will use one gloved finger to gently assess the muscles of the pelvic floor, seeing how well it contracts and relaxes and if there is muscle symmetry. After, the patient will get dressed and PT and patient will discuss exam findings and plan of care. PT and patient discuss plan of care, schedule, attendance policy and HEP activities.  PELVIC MMT:   MMT eval  Vaginal 2/5, 4s, 5 reps  Internal Anal Sphincter   External Anal Sphincter   Puborectalis   Diastasis Recti 1-1.5 finger width, decreased density   (Blank rows = not tested)        TONE: Decreased   PROLAPSE: Not seen in hooklying with cough  TODAY'S TREATMENT:                                                                                                                              DATE:   04/17/24 EVAL Examination completed, findings reviewed, pt educated on POC, HEP. Pt motivated to participate in PT and agreeable to attempt recommendations.    If treatment provided at initial evaluation, no treatment charged due to lack of authorization.      05/01/24: Diaphragmatic breathing x10 in supine Diaphragmatic breathing with pelvic floor activation with exhale, lengthening with inhale x15 Diaphragmatic breathing with pelvic floor activation 2x10 Hooklying ball squeezes 2x10 with pelvic floor activation and exhale Single knee to chest 3x30s each Windshield wipers x10 each Hooklying ball squeezes between hands with exhale for improved transverse abdominis activation - no back pain with this Clams in sidelying but increased hip pain changed to hip abduction in sidelying with ball press 2x10 no back pain  2x10 sit to stands with pelvic floor activation with exhale and pelvic floor activation Pt educated on urge drill and knack for improved urinary incontinence   06/12/24: Moist heat at low back with x4 layers between skin and pack 10  mins with beginning exercises - skin monitored post removal no adverse reactions noted and pt denied any concerns.  Hooklying transverse abdominis activation - max cues with poor techniques noted, breath holding and overuse of low back and made pain worse - cued for ball squeeze in hands with exhale and able to have transverse abdominis activation consistently without worsening back pain still unable to complete in isolation without ball press x20 completed Hooklying opposite hand/knee ball press 2x10 each with exhale Hooklying blue band 2x10 bil shoulder horizontal abduction with transverse abdominis activation and exhale Windshield wipers x10 each Bridges 2x10 with exhale and pelvic floor contractions Hooklying hip abduction blue band 2x10   PATIENT EDUCATION:  Education details: [email or web address] Person educated: Patient Education method: Explanation, Demonstration, Tactile cues, Verbal cues, and Handouts Education comprehension: verbalized understanding, returned demonstration, verbal cues required, tactile cues required, and needs further education  HOME EXERCISE PROGRAM: Access Code: [email or web address] URL: [email or web address] Date: 04/17/2024 Prepared by: Darryle  Exercises - Supine Diaphragmatic  Breathing  - 1 x daily - 7 x weekly - 2 sets - 10 reps - Supine Pelvic Floor Contraction  - 1 x daily - 7 x weekly - 2 sets - 10 reps - Supine Hip Internal and External Rotation  - 1 x daily - 7 x weekly - 1 sets - 10 reps - Supine Butterfly Groin Stretch  - 1 x daily - 7 x weekly - 3 sets - 30s hold - Diaphragmatic Breathing in Child's Pose with Pelvic Floor Relaxation  - 1 x daily - 7 x weekly - 3 sets - 30s hold - Seated Happy Baby With Trunk Flexion For Pelvic Relaxation  - 1 x daily - 7 x weekly - 1 sets - 3 reps - 30s holds  ASSESSMENT:  CLINICAL IMPRESSION: Patient is a 39 y.o. female  who was seen today for physical therapy treatment for postpartum back pain, pelvic pain, fecal  incontinence, urinary incontinence with stressors and urgency. Pt reports improvement mostly with urine since last session but was sick and had to cancel an appointment between then and now. Pt tolerated today's session well with mores strengthening exercises, does need cues for coordination of breathing with all exercises but pain improved throughout. Pt would benefit from additional PT to further address deficits.    OBJECTIVE IMPAIRMENTS: decreased activity tolerance, decreased coordination, decreased endurance, decreased mobility, decreased strength, increased fascial restrictions, increased muscle spasms, impaired flexibility, improper body mechanics, postural dysfunction, and pain.   ACTIVITY LIMITATIONS: carrying, lifting, bending, sitting, transfers, continence, locomotion level, and caring for others  PARTICIPATION LIMITATIONS: cleaning, shopping, community activity, occupation, and yard work  PERSONAL FACTORS: Fitness, Past/current experiences, Time since onset of injury/illness/exacerbation, and 1 comorbidity: medical history are also affecting patient's functional outcome.   REHAB POTENTIAL: Good  CLINICAL DECISION MAKING: Stable/uncomplicated  EVALUATION COMPLEXITY: Low   GOALS: Goals reviewed with patient? Yes  SHORT TERM GOALS: Target date: 05/15/24  Pt to be I with HEP for carry over and continuing recommendations for improved outcomes.   Baseline: Goal status: MET  2.  Pt will be independent with the knack, urge suppression technique, and double voiding in order to improve bladder habits and decrease urinary incontinence.   Baseline:  Goal status: MET  3.  Pt will be independent with use of squatty potty, relaxed toileting mechanics, and improved bowel movement techniques in order to increase ease of bowel movements and complete evacuation.   Baseline:  Goal status: on going  4.  Pt will be able to correctly perform diaphragmatic breathing and appropriate pressure  management in order to  improve pelvic floor A/ROM.   Baseline:  Goal status: on going  5.  Pt to consistently demonstrate transverse abdominis activation activation for improved pelvic stability for improved tolerance to carrying children without pain.  Baseline:  Goal status: on going   LONG TERM GOALS: Target date: 10/15/24  Pt to be I with advanced HEP for carry over and continuing recommendations for improved outcomes.   Baseline:  Goal status: INITIAL  2.  Pt will be able to functional actions such as working full shift without leakage due to improved pelvic floor strength and coordination.  Baseline:  Goal status: INITIAL  3.  Pt to demonstrate at least 5/5 bil hip strength for improved pelvic stability and functional squats without leakage.  Baseline:  Goal status: INITIAL  4.  Pt to demonstrate improved core and hip strength evident by ability to complete x10 single leg sit to stands without  pain or compensation for improved tolerance to carrying and walking with children.  Baseline:  Goal status: INITIAL  5.  Pt to report ability to sit for at least 1 hour for ability to better tolerate road trip without pain.  Baseline:  Goal status: INITIAL  6.  Pt to demonstrate improved midline upright posture for at least 30 mins for decreased strain at pelvic floor.  Baseline:  Goal status: INITIAL  PLAN:  PT FREQUENCY: 1x/week  PT DURATION: 8 sessions  PLANNED INTERVENTIONS: 97110-Therapeutic exercises, 97530- Therapeutic activity, [id number]- Neuromuscular re-education, 97535- Self Care, 02859- Manual therapy, [id number]- Canalith repositioning, [id number]- Aquatic Therapy, [phone]- Electrical stimulation (manual), [phone] (1-2 muscles), 20561 (3+ muscles)- Dry Needling, Patient/Family education, Balance training, Taping, Joint mobilization, Spinal mobilization, Scar mobilization, DME instructions, Cryotherapy, Moist heat, and Biofeedback  PLAN FOR NEXT SESSION: core and hip strengthening,  pelvic floor coordination, diaphragmatic breathing, stretching thoracic and lumbar spine, voiding mechanics.    Darryle Navy, PT, DPT 10/29/252:03 PM  [facility] [address], [address] [place], [place] [address] Phone # [phone] Fax [phone]

## 2024-06-13 ENCOUNTER — Encounter: Admitting: Physical Therapy

## 2024-06-14 ENCOUNTER — Other Ambulatory Visit: Payer: Self-pay

## 2024-06-14 ENCOUNTER — Other Ambulatory Visit: Payer: Self-pay | Admitting: *Deleted

## 2024-06-14 DIAGNOSIS — O24419 Gestational diabetes mellitus in pregnancy, unspecified control: Secondary | ICD-10-CM

## 2024-06-14 NOTE — Patient Outreach (Signed)
 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  Visit completed with Patient  on the phone  Background:   Past Medical History:  Diagnosis Date   Complication of anesthesia    had severe back pain with last epidural   GERD (gastroesophageal reflux disease)    Gestational diabetes    History of chorioamnionitis 07/13/2023   Missed abortion 11/15/2021    Assessment: Patient Reported Symptoms:  Cognitive Cognitive Status: Able to follow simple commands, Alert and oriented to person, place, and time, Insightful and able to interpret abstract concepts, Normal speech and language skills   Healing Pattern: Average Health Facilitated by: Healthy diet, Prayer/meditation, Rest, Stress management  Neurological Neurological Review of Symptoms: Dizziness Neurological Comment: Occassional dizziness.  Reports that it could be due to her breastfeeding.  She reports that she is currently breast and bottle feeding.  HEENT HEENT Symptoms Reported: No symptoms reported HEENT Management Strategies: Adequate rest, Routine screening HEENT Self-Management Outcome: 4 (good)    Cardiovascular Cardiovascular Symptoms Reported: No symptoms reported Does patient have uncontrolled Hypertension?: No Cardiovascular Management Strategies: Adequate rest, Routine screening Cardiovascular Self-Management Outcome: 4 (good)  Respiratory Respiratory Symptoms Reported: No symptoms reported Respiratory Management Strategies: Adequate rest, Routine screening Respiratory Self-Management Outcome: 4 (good)  Endocrine Is patient diabetic?:  (Gestional Diabetes from 2024 pregnancy) Endocrine Self-Management Outcome: 4 (good) Endocrine  Comment: Patient has history of Gestational Diabetes  Gastrointestinal Gastrointestinal Symptoms Reported: Incontinence Additional Gastrointestinal Details: Patient had 3rd degree laceration in November 2024 after birth of her baby.  She informs me that since that time she has had incontinence of urine and stool.  She wears incontinence garments/pads.  She is also receiving pelvic floor rehab to help with incontinence of urine and stool. Gastrointestinal Management Strategies: Adequate rest, Bowel program, Incontinence garment/pad, Coping strategies Gastrointestinal Self-Management Outcome: 3 (uncertain)    Genitourinary Genitourinary Symptoms Reported: Incontinence Additional Genitourinary Details: Patient had 3rd degree laceration in November 2024 after birth of her baby. She informs me that since that time she has had incontinence of urine and stool. She wears incontinence garments/pads. She is also receiving pelvic floor rehab to help with incontinence of urine and stool Genitourinary Management Strategies: Adequate rest, Bladder program, Incontinence garment/pad  Integumentary Integumentary Symptoms Reported: Other Other Integumentary Symptoms: Patient reports that she has a Callous on her right toe.  Patient reports that she is using Carmol 10% cream on this area. Skin Management Strategies: Adequate rest, Medication therapy, Routine screening Skin Self-Management Outcome: 4 (good)  Musculoskeletal Musculoskelatal Symptoms Reviewed: No symptoms reported Musculoskeletal Management Strategies: Adequate rest, Routine screening Musculoskeletal Self-Management Outcome: 4 (good) Falls in the past year?: No Number of falls in past year: 1 or less Was there an injury with Fall?: No Fall Risk Category Calculator: 0 Patient Fall Risk Level: Low Fall Risk Patient at Risk for Falls Due to: No Fall Risks Fall risk Follow up: Falls evaluation completed  Psychosocial Psychosocial Symptoms Reported: No  symptoms reported     Quality of Family Relationships: helpful, involved, supportive Do you feel physically threatened by others?: No    06/14/2024    PHQ2-9 Depression Screening   Little interest or pleasure in doing things Not at all  Feeling down, depressed,  or hopeless Not at all  PHQ-2 - Total Score 0  Trouble falling or staying asleep, or sleeping too much    Feeling tired or having little energy    Poor appetite or overeating     Feeling bad about yourself - or that you are a failure or have let yourself or your family down    Trouble concentrating on things, such as reading the newspaper or watching television    Moving or speaking so slowly that other people could have noticed.  Or the opposite - being so fidgety or restless that you have been moving around a lot more than usual    Thoughts that you would be better off dead, or hurting yourself in some way    PHQ2-9 Total Score    If you checked off any problems, how difficult have these problems made it for you to do your work, take care of things at home, or get along with other people    Depression Interventions/Treatment      There were no vitals filed for this visit.  Medications Reviewed Today     Reviewed by Jorja Nichole LABOR, RN (Case Manager) on 06/14/24 at 1033  Med List Status: <None>   Medication Order Taking? Sig Documenting Provider Last Dose Status Informant  famotidine  (PEPCID ) 20 MG tablet [id number] Yes TAKE 1 TABLET BY MOUTH TWICE A DAY Nicholas Bar, MD  Active   urea  (CARMOL) 10 % cream [id number] Yes Apply topically as needed. Everhart, Kirstie, DO  Active             Recommendation:   PCP Follow-up Specialty provider follow-up : Rehab-06/20/24 Continue Current Plan of Care  Follow Up Plan:   Telephone follow-up : 3 weeks- 07/10/24 @ 10 am  Graysen Depaula, RN, BSN, Theatre Manager [employer] [phone]

## 2024-06-14 NOTE — Patient Instructions (Signed)
 Visit Information  Ms. Erin Avery was given information about Medicaid Managed Care team care coordination services as a part of their Healthy [facility] Medicaid benefit. Erin Avery   If you would like to schedule transportation through your Healthy [facility] plan, please call the following number at least 2 days in advance of your appointment: [phone]  For information about your ride after you set it up, call Ride Assist at [phone]. Use this number to activate a Will Call pickup, or if your transportation is late for a scheduled pickup. Use this number, too, if you need to make a change or cancel a previously scheduled reservation.  If you need transportation services right away, call [phone]. The after-hours call center is staffed 24 hours to handle ride assistance and urgent reservation requests (including discharges) 365 days a year. Urgent trips include sick visits, hospital discharge requests and life-sustaining treatment.  Call the [facility] Line at [phone], at any time, 24 hours a day, 7 days a week. If you are in danger or need immediate medical attention call 911.   Please see education materials related to Incontinence of bowel and bladder provided by MyChart link.  Care plan and visit instructions communicated with the patient verbally today. Patient agrees to receive a copy in MyChart. Active MyChart status and patient understanding of how to access instructions and care plan via MyChart confirmed with patient.     Telephone follow up appointment with Managed Medicaid care management team member scheduled for: 07/10/24 @ 10 am  Montanna Mcbain, RN, BSN, [facility] RN Care Manager [facility] Population Health [phone]   Visit Information  Erin Avery was given information about Medicaid Managed Care team care coordination services as a part of their Healthy [facility] Medicaid benefit. Erin Avery   If you would like to schedule transportation  through your Healthy [facility] plan, please call the following number at least 2 days in advance of your appointment: [phone]  For information about your ride after you set it up, call Ride Assist at [phone]. Use this number to activate a Will Call pickup, or if your transportation is late for a scheduled pickup. Use this number, too, if you need to make a change or cancel a previously scheduled reservation.  If you need transportation services right away, call [phone]. The after-hours call center is staffed 24 hours to handle ride assistance and urgent reservation requests (including discharges) 365 days a year. Urgent trips include sick visits, hospital discharge requests and life-sustaining treatment.  Call the [facility] Line at [phone], at any time, 24 hours a day, 7 days a week. If you are in danger or need immediate medical attention call 911.   Please see education materials related to Incontinence of bowel and bladder provided by MyChart link.  Care plan and visit instructions communicated with the patient verbally today. Patient agrees to receive a copy in MyChart. Active MyChart status and patient understanding of how to access instructions and care plan via MyChart confirmed with patient.     Telephone follow up appointment with Managed Medicaid care management team member scheduled for: 07/10/24 @ 10 am  Janney Priego, RN, BSN, Theatre Manager [employer] [phone]

## 2024-06-17 ENCOUNTER — Other Ambulatory Visit: Payer: Self-pay

## 2024-06-17 NOTE — Patient Instructions (Signed)
 Cledith Serena - I am sorry I was unable to reach you today for our scheduled appointment. I work with Nicholas Bar, MD and am calling to support your healthcare needs. Please contact me at [phone] at your earliest convenience. I look forward to speaking with you soon.   Thank you,  Thersia Hoar, BSW, MHA [place]  [facility] Social Worker, Population Health [phone]

## 2024-06-17 NOTE — Patient Outreach (Signed)
 Error

## 2024-06-20 ENCOUNTER — Ambulatory Visit: Attending: Family Medicine

## 2024-06-20 ENCOUNTER — Telehealth: Payer: Self-pay

## 2024-06-20 DIAGNOSIS — R293 Abnormal posture: Secondary | ICD-10-CM | POA: Insufficient documentation

## 2024-06-20 DIAGNOSIS — M6281 Muscle weakness (generalized): Secondary | ICD-10-CM | POA: Insufficient documentation

## 2024-06-20 DIAGNOSIS — R279 Unspecified lack of coordination: Secondary | ICD-10-CM | POA: Insufficient documentation

## 2024-06-20 NOTE — Telephone Encounter (Signed)
 Called and left message after no-show appointment. Told when next appointment was. Attendance policy explained.

## 2024-06-20 NOTE — Therapy (Incomplete)
 OUTPATIENT PHYSICAL THERAPY FEMALE PELVIC TREATMENT   Patient Name: Erin Avery MRN: [id number] DOB:1986-03-18, 38 y.o., female Today's Date: 06/20/2024  END OF SESSION:     Past Medical History:  Diagnosis Date   Complication of anesthesia    had severe back pain with last epidural   GERD (gastroesophageal reflux disease)    Gestational diabetes    History of chorioamnionitis 07/13/2023   Missed abortion 11/15/2021   Past Surgical History:  Procedure Laterality Date   CESAREAN SECTION     DILATION AND EVACUATION N/A 11/24/2021   Procedure: DILATATION AND EVACUATION;  Surgeon: Zina Jerilynn LABOR, MD;  Location: [facility] OR;  Service: Gynecology;  Laterality: N/A;   Patient Active Problem List   Diagnosis Date Noted   IUD (intrauterine device) in place 10/02/2023   History of chorioamnionitis 07/13/2023   H/O: C-section 07/13/2023   History of gestational diabetes 05/18/2023   Rash 04/10/2023   History of cesarean delivery 12/09/2022    PCP: Nicholas Bar, MD   REFERRING PROVIDER: McDiarmid, Krystal BIRCH, MD  REFERRING DIAG: R15.9 (ICD-10-CM) - Incontinence of feces, unspecified fecal incontinence type  THERAPY DIAG:  No diagnosis found.  Rationale for Evaluation and Treatment: Rehabilitation  ONSET DATE: 9 months   SUBJECTIVE:                                                                                                                                                                                           SUBJECTIVE STATEMENT: Still having fecal and urine incontinence with stress and urge (small window of urge to small loss of both). Urgency with stool is still having leakage and looser stool with leakage. Urinary incontinence still happening with sneezing only, rest has gotten better.   Back pain still present.  Has been sick recently and hasn't done HEP in the last few day but was prior to that.  Urge drill has been doing great and able to make it dry now.     PAIN:  Are you having pain? Yes NPRS scale: 5/10 Pain location: low back   Pain type: aching Pain description: constant   Aggravating factors: bending, sitting at work longer 15 mins, transfers, carrying baby (19#) Relieving factors: tylenol , heat, massage  PRECAUTIONS: Other: postpartum   RED FLAGS: None   WEIGHT BEARING RESTRICTIONS: No  FALLS:  Has patient fallen in last 6 months? No  OCCUPATION: threading brows (8 hours, 3 days)  ACTIVITY LEVEL : moderate   PLOF: Independent  PATIENT GOALS: to have less pain and less leakage to complete work day and child care  PERTINENT HISTORY:  X1 c-section, x1 vaginal birth with  3rd deg tearing Sexual abuse: No  BOWEL MOVEMENT: Pain with bowel movement: No Type of bowel movement:Type (Bristol Stool Scale) 4, Frequency daily, and Strain no Fully empty rectum: Yes:   Leakage: Yes:   Pads: Yes: liner  Fiber supplement/laxative No  URINATION: Pain with urination: No Fully empty bladder: Yes:   Stream: Strong Urgency: Yes  Frequency: not quicker than every 2 hours, 2-3x Leakage: Urge to void, Coughing, Sneezing, Exercise, and Lifting Pads: Yes: liner  INTERCOURSE:  Ability to have vaginal penetration Yes  Pain with intercourse: none DrynessNo Climax: not painful Marinoff Scale: 0/3  PREGNANCY: Vaginal deliveries 1 Tearing 3rd degree  C-section deliveries 1 Currently pregnant No  PROLAPSE: None   OBJECTIVE:  Note: Objective measures were completed at Evaluation unless otherwise noted.  DIAGNOSTIC FINDINGS:    PATIENT SURVEYS:    PFIQ-7: 205  COGNITION: Overall cognitive status: Within functional limits for tasks assessed     SENSATION: Light touch: Appears intact  LUMBAR SPECIAL TESTS:  Single leg stance test: pelvic rotation bil and hip drop bil  FUNCTIONAL TESTS:  Functional squat - bil knee valgus; sit up test 1/3   GAIT: WFL  POSTURE: rounded shoulders, forward head, increased  thoracic kyphosis, and anterior pelvic tilt   LUMBARAROM/PROM:  A/PROM A/PROM  eval  Flexion Limited by 25%  Extension WFL  Right lateral flexion Limited by 25%  Left lateral flexion Limited by 25%  Right rotation Limited by 25%  Left rotation Limited by 25%   (Blank rows = not tested)  LOWER EXTREMITY ROM:  Bil hamstrings, adductors, hip ER and hip IR Limited by 25%  LOWER EXTREMITY MMT:  Bil hips grossly 3+/5 PALPATION:   General: TTP at bil piriformis, glute meds, and   Pelvic Alignment: WFL  Abdominal: tightness in lower abdominal quadrants                External Perineal Exam: TTP at perineal body                             Internal Pelvic Floor: TTP bil obturators, superficial layer bil  Patient confirms identification and approves PT to assess internal pelvic floor and treatment Yes No emotional/communication barriers or cognitive limitation. Patient is motivated to learn. Patient understands and agrees with treatment goals and plan. PT explains patient will be examined in standing, sitting, and lying down to see how their muscles and joints work. When they are ready, they will be asked to remove their underwear so PT can examine their perineum. The patient is also given the option of providing their own chaperone as one is not provided in our facility. The patient also has the right and is explained the right to defer or refuse any part of the evaluation or treatment including the internal exam. With the patient's consent, PT will use one gloved finger to gently assess the muscles of the pelvic floor, seeing how well it contracts and relaxes and if there is muscle symmetry. After, the patient will get dressed and PT and patient will discuss exam findings and plan of care. PT and patient discuss plan of care, schedule, attendance policy and HEP activities.  PELVIC MMT:   MMT eval  Vaginal 2/5, 4s, 5 reps  Internal Anal Sphincter   External Anal Sphincter    Puborectalis   Diastasis Recti 1-1.5 finger width, decreased density   (Blank rows = not tested)  TONE: Decreased   PROLAPSE: Not seen in hooklying with cough  TODAY'S TREATMENT:                                                                                                                              DATE:   04/17/24 EVAL Examination completed, findings reviewed, pt educated on POC, HEP. Pt motivated to participate in PT and agreeable to attempt recommendations.    If treatment provided at initial evaluation, no treatment charged due to lack of authorization.      05/01/24: Diaphragmatic breathing x10 in supine Diaphragmatic breathing with pelvic floor activation with exhale, lengthening with inhale x15 Diaphragmatic breathing with pelvic floor activation 2x10 Hooklying ball squeezes 2x10 with pelvic floor activation and exhale Single knee to chest 3x30s each Windshield wipers x10 each Hooklying ball squeezes between hands with exhale for improved transverse abdominis activation - no back pain with this Clams in sidelying but increased hip pain changed to hip abduction in sidelying with ball press 2x10 no back pain  2x10 sit to stands with pelvic floor activation with exhale and pelvic floor activation Pt educated on urge drill and knack for improved urinary incontinence   06/12/24: Moist heat at low back with x4 layers between skin and pack 10 mins with beginning exercises - skin monitored post removal no adverse reactions noted and pt denied any concerns.  Hooklying transverse abdominis activation - max cues with poor techniques noted, breath holding and overuse of low back and made pain worse - cued for ball squeeze in hands with exhale and able to have transverse abdominis activation consistently without worsening back pain still unable to complete in isolation without ball press x20 completed Hooklying opposite hand/knee ball press 2x10 each with exhale Hooklying blue  band 2x10 bil shoulder horizontal abduction with transverse abdominis activation and exhale Windshield wipers x10 each Bridges 2x10 with exhale and pelvic floor contractions Hooklying hip abduction blue band 2x10  06/20/24 Manual:  Neuromuscular re-education:  Exercises:  Therapeutic activities:   PATIENT EDUCATION:  Education details: [email or web address] Person educated: Patient Education method: Explanation, Demonstration, Tactile cues, Verbal cues, and Handouts Education comprehension: verbalized understanding, returned demonstration, verbal cues required, tactile cues required, and needs further education  HOME EXERCISE PROGRAM: Access Code: [email or web address] URL: [email or web address] Date: 04/17/2024 Prepared by: Darryle  Exercises - Supine Diaphragmatic Breathing  - 1 x daily - 7 x weekly - 2 sets - 10 reps - Supine Pelvic Floor Contraction  - 1 x daily - 7 x weekly - 2 sets - 10 reps - Supine Hip Internal and External Rotation  - 1 x daily - 7 x weekly - 1 sets - 10 reps - Supine Butterfly Groin Stretch  - 1 x daily - 7 x weekly - 3 sets - 30s hold - Diaphragmatic Breathing in Child's Pose with Pelvic Floor Relaxation  - 1 x daily - 7 x weekly - 3 sets - 30s hold - Seated Happy Baby  With Trunk Flexion For Pelvic Relaxation  - 1 x daily - 7 x weekly - 1 sets - 3 reps - 30s holds  ASSESSMENT:  CLINICAL IMPRESSION: Patient is a 38 y.o. female  who was seen today for physical therapy treatment for postpartum back pain, pelvic pain, fecal incontinence, urinary incontinence with stressors and urgency. Pt would benefit from additional PT to further address deficits.    OBJECTIVE IMPAIRMENTS: decreased activity tolerance, decreased coordination, decreased endurance, decreased mobility, decreased strength, increased fascial restrictions, increased muscle spasms, impaired flexibility, improper body mechanics, postural dysfunction, and pain.   ACTIVITY LIMITATIONS: carrying, lifting,  bending, sitting, transfers, continence, locomotion level, and caring for others  PARTICIPATION LIMITATIONS: cleaning, shopping, community activity, occupation, and yard work  PERSONAL FACTORS: Fitness, Past/current experiences, Time since onset of injury/illness/exacerbation, and 1 comorbidity: medical history are also affecting patient's functional outcome.   REHAB POTENTIAL: Good  CLINICAL DECISION MAKING: Stable/uncomplicated  EVALUATION COMPLEXITY: Low   GOALS: Goals reviewed with patient? Yes  SHORT TERM GOALS: Target date: 05/15/24  Pt to be I with HEP for carry over and continuing recommendations for improved outcomes.   Baseline: Goal status: MET  2.  Pt will be independent with the knack, urge suppression technique, and double voiding in order to improve bladder habits and decrease urinary incontinence.   Baseline:  Goal status: MET  3.  Pt will be independent with use of squatty potty, relaxed toileting mechanics, and improved bowel movement techniques in order to increase ease of bowel movements and complete evacuation.   Baseline:  Goal status: on going  4.  Pt will be able to correctly perform diaphragmatic breathing and appropriate pressure management in order to  improve pelvic floor A/ROM.   Baseline:  Goal status: on going  5.  Pt to consistently demonstrate transverse abdominis activation activation for improved pelvic stability for improved tolerance to carrying children without pain.  Baseline:  Goal status: on going   LONG TERM GOALS: Target date: 10/15/24  Pt to be I with advanced HEP for carry over and continuing recommendations for improved outcomes.   Baseline:  Goal status: INITIAL  2.  Pt will be able to functional actions such as working full shift without leakage due to improved pelvic floor strength and coordination.  Baseline:  Goal status: INITIAL  3.  Pt to demonstrate at least 5/5 bil hip strength for improved pelvic stability and  functional squats without leakage.  Baseline:  Goal status: INITIAL  4.  Pt to demonstrate improved core and hip strength evident by ability to complete x10 single leg sit to stands without pain or compensation for improved tolerance to carrying and walking with children.  Baseline:  Goal status: INITIAL  5.  Pt to report ability to sit for at least 1 hour for ability to better tolerate road trip without pain.  Baseline:  Goal status: INITIAL  6.  Pt to demonstrate improved midline upright posture for at least 30 mins for decreased strain at pelvic floor.  Baseline:  Goal status: INITIAL  PLAN:  PT FREQUENCY: 1x/week  PT DURATION: 8 sessions  PLANNED INTERVENTIONS: 97110-Therapeutic exercises, 97530- Therapeutic activity, [id number]- Neuromuscular re-education, 97535- Self Care, [id number]- Manual therapy, [phone]- Canalith repositioning, [id number]- Aquatic Therapy, [phone]- Electrical stimulation (manual), [phone] (1-2 muscles), [id number] (3+ muscles)- Dry Needling, Patient/Family education, Balance training, Taping, Joint mobilization, Spinal mobilization, Scar mobilization, DME instructions, Cryotherapy, Moist heat, and Biofeedback  PLAN FOR NEXT SESSION: core and hip strengthening, pelvic floor coordination, diaphragmatic breathing,  stretching thoracic and lumbar spine, voiding mechanics.    Josette Mares, PT, DPT11/06/258:01 AM [facility] [address], [address] [place], [place] [address] Phone # [phone] Fax [phone]

## 2024-06-26 ENCOUNTER — Telehealth: Payer: Self-pay | Admitting: *Deleted

## 2024-06-26 NOTE — Progress Notes (Signed)
 Complex Care Management Care Guide Note  06/26/2024 Name: [name] MRN: [id number] DOB: Dec 28, 1985  Erin Avery is a 38 y.o. year old female who is a primary care patient of Nicholas Bar, MD and is actively engaged with the care management team. I reached out to [facility] by phone today to assist with re-scheduling  with the BSW.  Follow up plan: Unsuccessful telephone outreach attempt made. A HIPAA compliant phone message was left for the patient providing contact information and requesting a return call.  Thedford Franks, CMA [place]  [facility], [facility] Guide Direct Dial : [phone]  Fax: [phone] Website: [email or web address]

## 2024-06-27 ENCOUNTER — Ambulatory Visit: Admitting: Physical Therapy

## 2024-06-27 DIAGNOSIS — R293 Abnormal posture: Secondary | ICD-10-CM | POA: Diagnosis not present

## 2024-06-27 DIAGNOSIS — M6281 Muscle weakness (generalized): Secondary | ICD-10-CM

## 2024-06-27 DIAGNOSIS — R279 Unspecified lack of coordination: Secondary | ICD-10-CM

## 2024-06-27 NOTE — Progress Notes (Signed)
 Complex Care Management Care Guide Note  06/27/2024 Name: Erin Avery MRN: [id number] DOB: 1985-09-15  Erin Avery is a 38 y.o. year old female who is a primary care patient of Nicholas Bar, MD and is actively engaged with the care management team. I reached out to [facility] by phone today to assist with re-scheduling  with the BSW.  Follow up plan: Telephone appointment with complex care management team member scheduled for:  07/08/2024  Thedford Franks, CMA [place]  [facility], [facility] Guide Direct Dial : [phone]  Fax: [phone] Website: [email or web address]

## 2024-06-27 NOTE — Patient Instructions (Signed)
 Types of Fiber  There are two main types of fiber:  insoluble and soluble.  Both of these types can prevent and relieve constipation and diarrhea, although some people find one or the other to be more easily digested.  This handout details information about both types of fiber. recommended 25-35 grams of fiber per day,  average 9-12 grams per meal   key is a balance between soluble and insoluble  Insoluble Fiber        Functions of Insoluble Fiber moves bulk through the intestines  controls and balances the pH (acidity) in the intestines   This type of fiber should be avoided or reduced if you have soft, frequent bowel movements or leakage      Benefits of Insoluble Fiber promotes regular bowel movement and prevents constipation  removes fecal waste through colon in less time  keeps an optimal pH in intestines to prevent microbes from producing cancer substances, therefore preventing colon cancer        Food Sources of Insoluble Fiber whole-wheat products  wheat bran "miller's bran" corn bran  flax seed or other seeds vegetables such as green beans, broccoli, cauliflower and potato skins  fruit skins and root vegetable skins  popcorn brown rice  Soluble Fiber( Types 5,6,7 - loose stools)       Functions of Soluble Fiber  holds water in the colon to bulk and soften the stool prolongs stomach emptying time so that sugar is released and absorbed more slowly  prevent leakage associated with soft, frequent bowel movements.        Benefits of Soluble Fiber lowers total cholesterol and LDL cholesterol (the bad cholesterol) therefore reducing the risk of heart disease  regulates blood sugar for people with diabetes       Food Sources of Soluble Fiber oat/oat bran dried beans and peas  nuts  barley  flax seed or other seeds fruits such as oranges, pears, peaches, and apples  vegetables such as carrots  psyllium husk  prunes

## 2024-06-27 NOTE — Progress Notes (Signed)
 Complex Care Management Care Guide Note  06/27/2024 Name: Erin Avery MRN: [id number] DOB: [birth year]  Erin Avery is a 38 y.o. year old female who is a primary care patient of Nicholas Bar, MD and is actively engaged with the care management team. I reached out to [facility] by phone today to assist with re-scheduling  with the RN Case Manager.  Follow up plan: Unsuccessful telephone outreach attempt made. A HIPAA compliant phone message was left for the patient providing contact information and requesting a return call. No further outreach attempts will be made due to inability to maintain patient contact.   Thedford Franks, CMA [place]  [facility], [facility] Guide Direct Dial : [phone]  Fax: [phone] Website: [email or web address]

## 2024-06-27 NOTE — Therapy (Signed)
 OUTPATIENT PHYSICAL THERAPY FEMALE PELVIC TREATMENT   Patient Name: Erin Avery MRN: [id number] DOB:Nov 14, 1985, 38 y.o., female Today's Date: 06/27/2024  END OF SESSION:  PT End of Session - 06/27/24 0857     Visit Number 4    Date for Recertification  10/15/24    Authorization Type healthy blue    Authorization Time Period CARELON APPROVED 10 VISITS 04/17/2024 - 07/15/2024    Authorization - Visit Number 3    Authorization - Number of Visits 10    PT Start Time 0857   arrival time   PT Stop Time 0930    PT Time Calculation (min) 33 min    Activity Tolerance Patient tolerated treatment well    Behavior During Therapy [facility] for tasks assessed/performed            Past Medical History:  Diagnosis Date   Complication of anesthesia    had severe back pain with last epidural   GERD (gastroesophageal reflux disease)    Gestational diabetes    History of chorioamnionitis 07/13/2023   Missed abortion 11/15/2021   Past Surgical History:  Procedure Laterality Date   CESAREAN SECTION     DILATION AND EVACUATION N/A 11/24/2021   Procedure: DILATATION AND EVACUATION;  Surgeon: Zina Jerilynn LABOR, MD;  Location: [facility] OR;  Service: Gynecology;  Laterality: N/A;   Patient Active Problem List   Diagnosis Date Noted   IUD (intrauterine device) in place 10/02/2023   History of chorioamnionitis 07/13/2023   H/O: C-section 07/13/2023   History of gestational diabetes 05/18/2023   Rash 04/10/2023   History of cesarean delivery 12/09/2022    PCP: Nicholas Bar, MD   REFERRING PROVIDER: McDiarmid, Krystal BIRCH, MD  REFERRING DIAG: R15.9 (ICD-10-CM) - Incontinence of feces, unspecified fecal incontinence type  THERAPY DIAG:  Muscle weakness (generalized)  Abnormal posture  Unspecified lack of coordination  Rationale for Evaluation and Treatment: Rehabilitation  ONSET DATE: 9 months   SUBJECTIVE:                                                                                                                                                                                            SUBJECTIVE STATEMENT: Urine leakage is improving now only with urge but more often than not getting there dry. Though is still urgent often.   Bowel leakage - still with coughing and sneezing, and bowel movement are always loose.      PAIN:  Are you having pain? Yes NPRS scale: 3/10 Pain location: low back   Pain type: aching Pain description: constant   Aggravating factors: bending, sitting at work longer 15 mins, transfers,  carrying baby (19#) Relieving factors: tylenol , heat, massage  PRECAUTIONS: Other: postpartum   RED FLAGS: None   WEIGHT BEARING RESTRICTIONS: No  FALLS:  Has patient fallen in last 6 months? No  OCCUPATION: threading brows (8 hours, 3 days)  ACTIVITY LEVEL : moderate   PLOF: Independent  PATIENT GOALS: to have less pain and less leakage to complete work day and child care  PERTINENT HISTORY:  X1 c-section, x1 vaginal birth with 3rd deg tearing Sexual abuse: No  BOWEL MOVEMENT: Pain with bowel movement: No Type of bowel movement:Type (Bristol Stool Scale) 4, Frequency daily, and Strain no Fully empty rectum: Yes:   Leakage: Yes:   Pads: Yes: liner  Fiber supplement/laxative No  URINATION: Pain with urination: No Fully empty bladder: Yes:   Stream: Strong Urgency: Yes  Frequency: not quicker than every 2 hours, 2-3x Leakage: Urge to void, Coughing, Sneezing, Exercise, and Lifting Pads: Yes: liner  INTERCOURSE:  Ability to have vaginal penetration Yes  Pain with intercourse: none DrynessNo Climax: not painful Marinoff Scale: 0/3  PREGNANCY: Vaginal deliveries 1 Tearing 3rd degree  C-section deliveries 1 Currently pregnant No  PROLAPSE: None   OBJECTIVE:  Note: Objective measures were completed at Evaluation unless otherwise noted.  DIAGNOSTIC FINDINGS:    PATIENT SURVEYS:    PFIQ-7: 205  COGNITION: Overall  cognitive status: Within functional limits for tasks assessed     SENSATION: Light touch: Appears intact  LUMBAR SPECIAL TESTS:  Single leg stance test: pelvic rotation bil and hip drop bil  FUNCTIONAL TESTS:  Functional squat - bil knee valgus; sit up test 1/3   GAIT: WFL  POSTURE: rounded shoulders, forward head, increased thoracic kyphosis, and anterior pelvic tilt   LUMBARAROM/PROM:  A/PROM A/PROM  eval  Flexion Limited by 25%  Extension WFL  Right lateral flexion Limited by 25%  Left lateral flexion Limited by 25%  Right rotation Limited by 25%  Left rotation Limited by 25%   (Blank rows = not tested)  LOWER EXTREMITY ROM:  Bil hamstrings, adductors, hip ER and hip IR Limited by 25%  LOWER EXTREMITY MMT:  Bil hips grossly 3+/5 PALPATION:   General: TTP at bil piriformis, glute meds, and   Pelvic Alignment: WFL  Abdominal: tightness in lower abdominal quadrants                External Perineal Exam: TTP at perineal body                             Internal Pelvic Floor: TTP bil obturators, superficial layer bil  Patient confirms identification and approves PT to assess internal pelvic floor and treatment Yes No emotional/communication barriers or cognitive limitation. Patient is motivated to learn. Patient understands and agrees with treatment goals and plan. PT explains patient will be examined in standing, sitting, and lying down to see how their muscles and joints work. When they are ready, they will be asked to remove their underwear so PT can examine their perineum. The patient is also given the option of providing their own chaperone as one is not provided in our facility. The patient also has the right and is explained the right to defer or refuse any part of the evaluation or treatment including the internal exam. With the patient's consent, PT will use one gloved finger to gently assess the muscles of the pelvic floor, seeing how well it contracts and  relaxes and if there is muscle  symmetry. After, the patient will get dressed and PT and patient will discuss exam findings and plan of care. PT and patient discuss plan of care, schedule, attendance policy and HEP activities.  PELVIC MMT:   MMT eval  Vaginal 2/5, 4s, 5 reps  Internal Anal Sphincter   External Anal Sphincter   Puborectalis   Diastasis Recti 1-1.5 finger width, decreased density   (Blank rows = not tested)        TONE: Decreased   PROLAPSE: Not seen in hooklying with cough  TODAY'S TREATMENT:                                                                                                                              DATE:   04/17/24 EVAL Examination completed, findings reviewed, pt educated on POC, HEP. Pt motivated to participate in PT and agreeable to attempt recommendations.    If treatment provided at initial evaluation, no treatment charged due to lack of authorization.      05/01/24: Diaphragmatic breathing x10 in supine Diaphragmatic breathing with pelvic floor activation with exhale, lengthening with inhale x15 Diaphragmatic breathing with pelvic floor activation 2x10 Hooklying ball squeezes 2x10 with pelvic floor activation and exhale Single knee to chest 3x30s each Windshield wipers x10 each Hooklying ball squeezes between hands with exhale for improved transverse abdominis activation - no back pain with this Clams in sidelying but increased hip pain changed to hip abduction in sidelying with ball press 2x10 no back pain  2x10 sit to stands with pelvic floor activation with exhale and pelvic floor activation Pt educated on urge drill and knack for improved urinary incontinence   06/12/24: Moist heat at low back with x4 layers between skin and pack 10 mins with beginning exercises - skin monitored post removal no adverse reactions noted and pt denied any concerns.  Hooklying transverse abdominis activation - max cues with poor techniques noted, breath holding  and overuse of low back and made pain worse - cued for ball squeeze in hands with exhale and able to have transverse abdominis activation consistently without worsening back pain still unable to complete in isolation without ball press x20 completed Hooklying opposite hand/knee ball press 2x10 each with exhale Hooklying blue band 2x10 bil shoulder horizontal abduction with transverse abdominis activation and exhale Windshield wipers x10 each Bridges 2x10 with exhale and pelvic floor contractions Hooklying hip abduction blue band 2x10  06/27/24: Moist heat at low back with x4 layers between skin and pack with beginning exercises - skin monitored post removal no adverse reactions noted and pt denied any concerns.  Hooklying ball squeeze with transverse abdominis activation and exhale x20 Hooklying opposite hand/knee ball press 2x10 each leg + pelvic floor activation and exhale  Sidelying hip abduction blue band 2x10 each side with exhale and pelvic floor contraction Bird dog 2x10 each side with exhale and pelvic floor activation  Sit to stands 9# 2x10 with pelvic floor contraction and  exhale  Pt educated on posterior lean for more awareness of posterior pelvic floor activation with HEP and pt reports this helped her feel it more in clinic Also educated on fiber types as her stools are looser in nature.   PATIENT EDUCATION:  Education details: [email or web address] Person educated: Patient Education method: Explanation, Demonstration, Tactile cues, Verbal cues, and Handouts Education comprehension: verbalized understanding, returned demonstration, verbal cues required, tactile cues required, and needs further education  HOME EXERCISE PROGRAM: Access Code: [email or web address] URL: [email or web address] Date: 04/17/2024 Prepared by: Darryle  Exercises - Supine Diaphragmatic Breathing  - 1 x daily - 7 x weekly - 2 sets - 10 reps - Supine Pelvic Floor Contraction  - 1 x daily - 7 x weekly - 2 sets  - 10 reps - Supine Hip Internal and External Rotation  - 1 x daily - 7 x weekly - 1 sets - 10 reps - Supine Butterfly Groin Stretch  - 1 x daily - 7 x weekly - 3 sets - 30s hold - Diaphragmatic Breathing in Child's Pose with Pelvic Floor Relaxation  - 1 x daily - 7 x weekly - 3 sets - 30s hold - Seated Happy Baby With Trunk Flexion For Pelvic Relaxation  - 1 x daily - 7 x weekly - 1 sets - 3 reps - 30s holds  ASSESSMENT:  CLINICAL IMPRESSION: Patient is a 38 y.o. female  who was seen today for physical therapy treatment for postpartum back pain, pelvic pain, fecal incontinence, urinary incontinence with stressors and urgency. Pt reports a lot of improvement with urinary incontinence but stool concerns are about the same. Would like to work on this, demonstrated need of cues consistently for coordination of pelvic floor and breathing with exercises. Pt tolerating well denied any leakage or pain at end of session.  Pt would benefit from additional PT to further address deficits.    OBJECTIVE IMPAIRMENTS: decreased activity tolerance, decreased coordination, decreased endurance, decreased mobility, decreased strength, increased fascial restrictions, increased muscle spasms, impaired flexibility, improper body mechanics, postural dysfunction, and pain.   ACTIVITY LIMITATIONS: carrying, lifting, bending, sitting, transfers, continence, locomotion level, and caring for others  PARTICIPATION LIMITATIONS: cleaning, shopping, community activity, occupation, and yard work  PERSONAL FACTORS: Fitness, Past/current experiences, Time since onset of injury/illness/exacerbation, and 1 comorbidity: medical history are also affecting patient's functional outcome.   REHAB POTENTIAL: Good  CLINICAL DECISION MAKING: Stable/uncomplicated  EVALUATION COMPLEXITY: Low   GOALS: Goals reviewed with patient? Yes  SHORT TERM GOALS: Target date: 05/15/24  Pt to be I with HEP for carry over and continuing  recommendations for improved outcomes.   Baseline: Goal status: MET  2.  Pt will be independent with the knack, urge suppression technique, and double voiding in order to improve bladder habits and decrease urinary incontinence.   Baseline:  Goal status: MET  3.  Pt will be independent with use of squatty potty, relaxed toileting mechanics, and improved bowel movement techniques in order to increase ease of bowel movements and complete evacuation.   Baseline:  Goal status: on going  4.  Pt will be able to correctly perform diaphragmatic breathing and appropriate pressure management in order to  improve pelvic floor A/ROM.   Baseline:  Goal status: MET  5.  Pt to consistently demonstrate transverse abdominis activation activation for improved pelvic stability for improved tolerance to carrying children without pain.  Baseline:  Goal status: MET   LONG TERM GOALS: Target date: 10/15/24  Pt to be I with advanced  HEP for carry over and continuing recommendations for improved outcomes.   Baseline:  Goal status: on going  2.  Pt will be able to functional actions such as working full shift without leakage due to improved pelvic floor strength and coordination.  Baseline:  Goal status: on going  3.  Pt to demonstrate at least 5/5 bil hip strength for improved pelvic stability and functional squats without leakage.  Baseline:  Goal status: on going  4.  Pt to demonstrate improved core and hip strength evident by ability to complete x10 single leg sit to stands without pain or compensation for improved tolerance to carrying and walking with children.  Baseline:  Goal status: on going  5.  Pt to report ability to sit for at least 1 hour for ability to better tolerate road trip without pain.  Baseline:  Goal status: on going  6.  Pt to demonstrate improved midline upright posture for at least 30 mins for decreased strain at pelvic floor.  Baseline:  Goal status: on  going  PLAN:  PT FREQUENCY: 1x/week  PT DURATION: 8 sessions  PLANNED INTERVENTIONS: 97110-Therapeutic exercises, 97530- Therapeutic activity, [id number]- Neuromuscular re-education, 97535- Self Care, [id number]- Manual therapy, [phone]- Canalith repositioning, [id number]- Aquatic Therapy, [phone]- Electrical stimulation (manual), [phone] (1-2 muscles), [id number] (3+ muscles)- Dry Needling, Patient/Family education, Balance training, Taping, Joint mobilization, Spinal mobilization, Scar mobilization, DME instructions, Cryotherapy, Moist heat, and Biofeedback  PLAN FOR NEXT SESSION: core and hip strengthening, pelvic floor coordination, diaphragmatic breathing, stretching thoracic and lumbar spine, voiding mechanics.   Darryle Navy, PT, DPT 06/27/2509:26 AM  [facility] [address] [place], [place] [address] Phone # [phone] Fax [phone]

## 2024-07-04 ENCOUNTER — Ambulatory Visit: Admitting: Physical Therapy

## 2024-07-04 DIAGNOSIS — R293 Abnormal posture: Secondary | ICD-10-CM | POA: Diagnosis not present

## 2024-07-04 DIAGNOSIS — R279 Unspecified lack of coordination: Secondary | ICD-10-CM | POA: Diagnosis not present

## 2024-07-04 DIAGNOSIS — M6281 Muscle weakness (generalized): Secondary | ICD-10-CM | POA: Diagnosis not present

## 2024-07-04 NOTE — Therapy (Signed)
 OUTPATIENT PHYSICAL THERAPY FEMALE PELVIC TREATMENT   Patient Name: Erin Avery MRN: [id number] DOB:10/06/1985, 38 y.o., female Today's Date: 07/04/2024  END OF SESSION:  PT End of Session - 07/04/24 0848     Visit Number 5    Date for Recertification  10/15/24    Authorization Type healthy blue    Authorization Time Period CARELON APPROVED 10 VISITS 04/17/2024 - 07/15/2024    Authorization - Visit Number 4    Authorization - Number of Visits 10    PT Start Time 0848   arrival   PT Stop Time 0927    PT Time Calculation (min) 39 min    Activity Tolerance Patient tolerated treatment well    Behavior During Therapy [facility] for tasks assessed/performed             Past Medical History:  Diagnosis Date   Complication of anesthesia    had severe back pain with last epidural   GERD (gastroesophageal reflux disease)    Gestational diabetes    History of chorioamnionitis 07/13/2023   Missed abortion 11/15/2021   Past Surgical History:  Procedure Laterality Date   CESAREAN SECTION     DILATION AND EVACUATION N/A 11/24/2021   Procedure: DILATATION AND EVACUATION;  Surgeon: Zina Jerilynn LABOR, MD;  Location: [facility] OR;  Service: Gynecology;  Laterality: N/A;   Patient Active Problem List   Diagnosis Date Noted   IUD (intrauterine device) in place 10/02/2023   History of chorioamnionitis 07/13/2023   H/O: C-section 07/13/2023   History of gestational diabetes 05/18/2023   Rash 04/10/2023   History of cesarean delivery 12/09/2022    PCP: Nicholas Bar, MD   REFERRING PROVIDER: McDiarmid, Krystal BIRCH, MD  REFERRING DIAG: R15.9 (ICD-10-CM) - Incontinence of feces, unspecified fecal incontinence type  THERAPY DIAG:  Muscle weakness (generalized)  Abnormal posture  Unspecified lack of coordination  Rationale for Evaluation and Treatment: Rehabilitation  ONSET DATE: 9 months   SUBJECTIVE:                                                                                                                                                                                            SUBJECTIVE STATEMENT: Urine leakage is improving greatly now only having leakage about 1-2 weekly at the most and only with very high urgency per pt.  Bowel leakage - still with coughing and sneezing, and bowel movement are always loose. Does have an urge to empty bowels but even if she makes it in time she will have a little spot of fecal leakage in underwear. With high urgency of bowels will have more leakage. Leakage is always  loose/watery but now bowel movements are getting more formed.      PAIN:  07/04/24 no Are you having pain? Yes NPRS scale: 3/10 Pain location: low back   Pain type: aching Pain description: constant   Aggravating factors: bending, sitting at work longer 15 mins, transfers, carrying baby (19#) Relieving factors: tylenol , heat, massage  PRECAUTIONS: Other: postpartum   RED FLAGS: None   WEIGHT BEARING RESTRICTIONS: No  FALLS:  Has patient fallen in last 6 months? No  OCCUPATION: threading brows (8 hours, 3 days)  ACTIVITY LEVEL : moderate   PLOF: Independent  PATIENT GOALS: to have less pain and less leakage to complete work day and child care  PERTINENT HISTORY:  X1 c-section, x1 vaginal birth with 3rd deg tearing Sexual abuse: No  BOWEL MOVEMENT: Pain with bowel movement: No Type of bowel movement:Type (Bristol Stool Scale) 4, Frequency daily, and Strain no Fully empty rectum: Yes:   Leakage: Yes:   Pads: Yes: liner  Fiber supplement/laxative No  URINATION: Pain with urination: No Fully empty bladder: Yes:   Stream: Strong Urgency: Yes  Frequency: not quicker than every 2 hours, 2-3x Leakage: Urge to void, Coughing, Sneezing, Exercise, and Lifting Pads: Yes: liner  INTERCOURSE:  Ability to have vaginal penetration Yes  Pain with intercourse: none DrynessNo Climax: not painful Marinoff Scale: 0/3  PREGNANCY: Vaginal deliveries  1 Tearing 3rd degree  C-section deliveries 1 Currently pregnant No  PROLAPSE: None   OBJECTIVE:  Note: Objective measures were completed at Evaluation unless otherwise noted.  DIAGNOSTIC FINDINGS:    PATIENT SURVEYS:    PFIQ-7: 205  COGNITION: Overall cognitive status: Within functional limits for tasks assessed     SENSATION: Light touch: Appears intact  LUMBAR SPECIAL TESTS:  Single leg stance test: pelvic rotation bil and hip drop bil  FUNCTIONAL TESTS:  Functional squat - bil knee valgus; sit up test 1/3   GAIT: WFL  POSTURE: rounded shoulders, forward head, increased thoracic kyphosis, and anterior pelvic tilt   LUMBARAROM/PROM:  A/PROM A/PROM  eval  Flexion Limited by 25%  Extension WFL  Right lateral flexion Limited by 25%  Left lateral flexion Limited by 25%  Right rotation Limited by 25%  Left rotation Limited by 25%   (Blank rows = not tested)  LOWER EXTREMITY ROM:  Bil hamstrings, adductors, hip ER and hip IR Limited by 25%  LOWER EXTREMITY MMT:  Bil hips grossly 3+/5 PALPATION:   General: TTP at bil piriformis, glute meds, and   Pelvic Alignment: WFL  Abdominal: tightness in lower abdominal quadrants                External Perineal Exam: TTP at perineal body                             Internal Pelvic Floor: TTP bil obturators, superficial layer bil  Patient confirms identification and approves PT to assess internal pelvic floor and treatment Yes No emotional/communication barriers or cognitive limitation. Patient is motivated to learn. Patient understands and agrees with treatment goals and plan. PT explains patient will be examined in standing, sitting, and lying down to see how their muscles and joints work. When they are ready, they will be asked to remove their underwear so PT can examine their perineum. The patient is also given the option of providing their own chaperone as one is not provided in our facility. The patient also  has the right and  is explained the right to defer or refuse any part of the evaluation or treatment including the internal exam. With the patient's consent, PT will use one gloved finger to gently assess the muscles of the pelvic floor, seeing how well it contracts and relaxes and if there is muscle symmetry. After, the patient will get dressed and PT and patient will discuss exam findings and plan of care. PT and patient discuss plan of care, schedule, attendance policy and HEP activities.  PELVIC MMT:   MMT eval 07/04/24  Vaginal 2/5, 4s, 5 reps   Internal Anal Sphincter  3/5  External Anal Sphincter  3/5, 8s, 10 reps   Puborectalis  3/5  Diastasis Recti 1-1.5 finger width, decreased density    (Blank rows = not tested)        TONE: Decreased   PROLAPSE: Not seen in hooklying with cough  TODAY'S TREATMENT:                                                                                                                              DATE:    06/12/24: Moist heat at low back with x4 layers between skin and pack 10 mins with beginning exercises - skin monitored post removal no adverse reactions noted and pt denied any concerns.  Hooklying transverse abdominis activation - max cues with poor techniques noted, breath holding and overuse of low back and made pain worse - cued for ball squeeze in hands with exhale and able to have transverse abdominis activation consistently without worsening back pain still unable to complete in isolation without ball press x20 completed Hooklying opposite hand/knee ball press 2x10 each with exhale Hooklying blue band 2x10 bil shoulder horizontal abduction with transverse abdominis activation and exhale Windshield wipers x10 each Bridges 2x10 with exhale and pelvic floor contractions Hooklying hip abduction blue band 2x10  06/27/24: Moist heat at low back with x4 layers between skin and pack with beginning exercises - skin monitored post removal no  adverse reactions noted and pt denied any concerns.  Hooklying ball squeeze with transverse abdominis activation and exhale x20 Hooklying opposite hand/knee ball press 2x10 each leg + pelvic floor activation and exhale  Sidelying hip abduction blue band 2x10 each side with exhale and pelvic floor contraction Bird dog 2x10 each side with exhale and pelvic floor activation  Sit to stands 9# 2x10 with pelvic floor contraction and exhale  Pt educated on posterior lean for more awareness of posterior pelvic floor activation with HEP and pt reports this helped her feel it more in clinic Also educated on fiber types as her stools are looser in nature.   07/04/24: Patient consented to internal pelvic floor assessment and treatment  rectally this date and found to have decreased strength, endurance, and coordination. 3x10 pelvic floor contractions with quick release NMRE technique provided from skilled PT for improved strengthening and activation of posterior pelvic floor. Pt does demonstrate weakness throughout but worse  in lower quadrant of external anal sphincter (toward vagina). X5 isometrics (best at 21s with max cues, average ~8s) Updated HEP for more posterior pelvic floor/glute/hip strengthening for home and reviewed with pt  PATIENT EDUCATION:  Education details: [email or web address] Person educated: Patient Education method: Explanation, Demonstration, Tactile cues, Verbal cues, and Handouts Education comprehension: verbalized understanding, returned demonstration, verbal cues required, tactile cues required, and needs further education  HOME EXERCISE PROGRAM: Access Code: [email or web address] URL: [email or web address] Date: 04/17/2024 Prepared by: Darryle  Exercises - Supine Diaphragmatic Breathing  - 1 x daily - 7 x weekly - 2 sets - 10 reps - Supine Pelvic Floor Contraction  - 1 x daily - 7 x weekly - 2 sets - 10 reps - Supine Hip Internal and External Rotation  - 1 x daily - 7 x weekly - 1 sets  - 10 reps - Supine Butterfly Groin Stretch  - 1 x daily - 7 x weekly - 3 sets - 30s hold - Diaphragmatic Breathing in Child's Pose with Pelvic Floor Relaxation  - 1 x daily - 7 x weekly - 3 sets - 30s hold - Seated Happy Baby With Trunk Flexion For Pelvic Relaxation  - 1 x daily - 7 x weekly - 1 sets - 3 reps - 30s holds  ASSESSMENT:  CLINICAL IMPRESSION: Patient is a 38 y.o. female  who was seen today for physical therapy treatment for postpartum back pain, pelvic pain, fecal incontinence, urinary incontinence with stressors and urgency. Pt has almost complete resolution of urinary incontinence but does still have bowel leakage with urgency and looser stools. Improving but not resolved. Pt reports she feels like she is getting stronger and her back pain is getting better overall. Progressing towards all goals. Pt consented to internal rectal pelvic floor assessment and treatment today, demonstrated continued pelvic floor weakness but more so at lower quadrant toward vaginal, with NMRE technique of quick release pt had greatly improved strength to 3/5 throughout though endurance still decreased. Pt reports her medicaid is ending next week and she is uncertain of if she is able to have it another year, has appointment to find out more information on this. Pt would benefit from additional PT but unable to make more appointments until she knows about coverage.   OBJECTIVE IMPAIRMENTS: decreased activity tolerance, decreased coordination, decreased endurance, decreased mobility, decreased strength, increased fascial restrictions, increased muscle spasms, impaired flexibility, improper body mechanics, postural dysfunction, and pain.   ACTIVITY LIMITATIONS: carrying, lifting, bending, sitting, transfers, continence, locomotion level, and caring for others  PARTICIPATION LIMITATIONS: cleaning, shopping, community activity, occupation, and yard work  PERSONAL FACTORS: Fitness, Past/current experiences, Time  since onset of injury/illness/exacerbation, and 1 comorbidity: medical history are also affecting patient's functional outcome.   REHAB POTENTIAL: Good  CLINICAL DECISION MAKING: Stable/uncomplicated  EVALUATION COMPLEXITY: Low   GOALS: Goals reviewed with patient? Yes  SHORT TERM GOALS: Target date: 05/15/24  Pt to be I with HEP for carry over and continuing recommendations for improved outcomes.   Baseline: Goal status: MET  2.  Pt will be independent with the knack, urge suppression technique, and double voiding in order to improve bladder habits and decrease urinary incontinence.   Baseline:  Goal status: MET  3.  Pt will be independent with use of squatty potty, relaxed toileting mechanics, and improved bowel movement techniques in order to increase ease of bowel movements and complete evacuation.   Baseline:  Goal status: MET  4.  Pt will be able to correctly perform  diaphragmatic breathing and appropriate pressure management in order to  improve pelvic floor A/ROM.   Baseline:  Goal status: MET  5.  Pt to consistently demonstrate transverse abdominis activation activation for improved pelvic stability for improved tolerance to carrying children without pain.  Baseline:  Goal status: MET   LONG TERM GOALS: Target date: 10/15/24  Pt to be I with advanced HEP for carry over and continuing recommendations for improved outcomes.   Baseline:  Goal status: on going 11/20  2.  Pt will be able to functional actions such as working full shift without leakage due to improved pelvic floor strength and coordination.  Baseline:  Goal status: on going - due to bowel leakage, met for urine 11/20  3.  Pt to demonstrate at least 5/5 bil hip strength for improved pelvic stability and functional squats without leakage.  Baseline:  Goal status: on going 11/20 4+/5  4.  Pt to demonstrate improved core and hip strength evident by ability to complete x10 single leg sit to stands without  pain or compensation for improved tolerance to carrying and walking with children.  Baseline:  Goal status: on going 11/20 con complete x10 but with compensation   5.  Pt to report ability to sit for at least 1 hour for ability to better tolerate road trip without pain.  Baseline:  Goal status: met 11/20  6.  Pt to demonstrate improved midline upright posture for at least 30 mins for decreased strain at pelvic floor.  Baseline:  Goal status: on going 11/20 20 mins  PLAN:  PT FREQUENCY: 1x/week  PT DURATION: 8 sessions  PLANNED INTERVENTIONS: 97110-Therapeutic exercises, 97530- Therapeutic activity, 97112- Neuromuscular re-education, 97535- Self Care, 02859- Manual therapy, [phone]- Canalith repositioning, [id number]- Aquatic Therapy, [phone]- Electrical stimulation (manual), [phone] (1-2 muscles), [id number] (3+ muscles)- Dry Needling, Patient/Family education, Balance training, Taping, Joint mobilization, Spinal mobilization, Scar mobilization, DME instructions, Cryotherapy, Moist heat, and Biofeedback  PLAN FOR NEXT SESSION: core and hip strengthening, pelvic floor coordination, diaphragmatic breathing, stretching thoracic and lumbar spine, voiding mechanics.   Darryle Navy, PT, DPT 11/20/259:34 AM  [facility] [address], [address] [place], [place] [address] Phone # [phone] Fax [phone]

## 2024-07-08 ENCOUNTER — Other Ambulatory Visit: Payer: Self-pay

## 2024-07-08 NOTE — Patient Instructions (Signed)
 Visit Information  Erin Avery was given information about Medicaid Managed Care team care coordination services as a part of their Healthy [facility] Medicaid benefit. Mitsue Dlouhy   If you would like to schedule transportation through your Healthy [facility] plan, please call the following number at least 2 days in advance of your appointment: [phone]  For information about your ride after you set it up, call Ride Assist at [phone]. Use this number to activate a Will Call pickup, or if your transportation is late for a scheduled pickup. Use this number, too, if you need to make a change or cancel a previously scheduled reservation.  If you need transportation services right away, call [phone]. The after-hours call center is staffed 24 hours to handle ride assistance and urgent reservation requests (including discharges) 365 days a year. Urgent trips include sick visits, hospital discharge requests and life-sustaining treatment.  Call the [facility] Line at [phone], at any time, 24 hours a day, 7 days a week. If you are in danger or need immediate medical attention call 911.   The Patient has been provided with contact information for the Managed Medicaid care management team and has been advised to call with any health related questions or concerns.   Thersia Hoar, HEDWIG, MHA [place]  [facility] Social Worker, Population Health [phone]   Following is a copy of your plan of care:  There are no care plans that you recently modified to display for this patient.

## 2024-07-08 NOTE — Patient Outreach (Signed)
 Social Drivers of Health  Community Resource and Care Coordination Visit Note   07/08/2024  Name: Erin Avery MRN: [id number] DOB:1986/06/30  Situation: Referral received for [facility] needs assessment and assistance related to Medicaid questions. I obtained verbal consent from Patient.  Visit completed with Patient on the phone.   Background:      Assessment:   Goals Addressed   None     Recommendation:   Contact Dss worker or Healthy blue to find out when Medicaid will end. Contact SW for any future needs.  Follow Up Plan:   Patient declines further calls or assistance. [employer] will be closed. Patient has been provided contact information should new needs arise.   Thersia Hoar, HEDWIG, MHA [place]  [facility] Social Worker, Population Health [phone]

## 2024-07-10 ENCOUNTER — Other Ambulatory Visit: Payer: Self-pay | Admitting: *Deleted

## 2024-07-10 ENCOUNTER — Other Ambulatory Visit: Payer: Self-pay
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
## 2024-07-10 NOTE — Patient Instructions (Signed)
 Visit Information  Erin Avery was given information about Medicaid Managed Care team care coordination services as a part of their Healthy [facility] Medicaid benefit. Erin Avery   If you would like to schedule transportation through your Healthy [facility] plan, please call the following number at least 2 days in advance of your appointment: [phone]  For information about your ride after you set it up, call Ride Assist at [phone]. Use this number to activate a Will Call pickup, or if your transportation is late for a scheduled pickup. Use this number, too, if you need to make a change or cancel a previously scheduled reservation.  If you need transportation services right away, call [phone]. The after-hours call center is staffed 24 hours to handle ride assistance and urgent reservation requests (including discharges) 365 days a year. Urgent trips include sick visits, hospital discharge requests and life-sustaining treatment.  Call the [facility] Line at [phone], at any time, 24 hours a day, 7 days a week. If you are in danger or need immediate medical attention call 911.   Please see education materials related to Incontinence  provided by MyChart link.  Care plan and visit instructions communicated with the patient verbally today. Patient agrees to receive a copy in MyChart. Active MyChart status and patient understanding of how to access instructions and care plan via MyChart confirmed with patient.     Telephone follow up appointment with Managed Medicaid care management team member scheduled for: 08/26/24 @ 9:30 am  Erin Jasinski, RN, BSN, ACM RN Care Manager [facility] [phone]   Following is a copy of your plan of care:  There are no care plans that you recently modified to display for this patient.  Visit Information  Erin Avery was given information about Medicaid Managed Care team care coordination services as a part  of their Healthy [facility] Medicaid benefit. Erin Avery   If you would like to schedule transportation through your Healthy [facility] plan, please call the following number at least 2 days in advance of your appointment: [phone]  For information about your ride after you set it up, call Ride Assist at [phone]. Use this number to activate a Will Call pickup, or if your transportation is late for a scheduled pickup. Use this number, too, if you need to make a change or cancel a previously scheduled reservation.  If you need transportation services right away, call [phone]. The after-hours call center is staffed 24 hours to handle ride assistance and urgent reservation requests (including discharges) 365 days a year. Urgent trips include sick visits, hospital discharge requests and life-sustaining treatment.  Call the [facility] Line at [phone], at any time, 24 hours a day, 7 days a week. If you are in danger or need immediate medical attention call 911.   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  Care plan and visit instructions communicated with the patient verbally today. Patient agrees to receive a copy in MyChart. Active MyChart status and patient understanding of how to access instructions and care plan via MyChart confirmed with patient.     Telephone follow up appointment with Managed Medicaid care management team member scheduled for:08/26/24 @ 9:30 am  Erin Mulkern, RN, BSN, [employer] RN Care Manager [email or web address] [phone]

## 2024-08-26 ENCOUNTER — Telehealth: Payer: Self-pay | Admitting: *Deleted

## 2024-09-09 ENCOUNTER — Encounter: Payer: Self-pay | Admitting: Family Medicine

## 2024-09-16 ENCOUNTER — Encounter

## 2024-09-25 ENCOUNTER — Encounter: Admitting: Student

## 2024-10-11 ENCOUNTER — Telehealth: Payer: Self-pay | Admitting: *Deleted
